# Patient Record
Sex: Male | Born: 1955 | Race: White | Hispanic: No | Marital: Married | Smoking: Never smoker
Health system: Southern US, Community
[De-identification: ages and names within clinical notes are randomized; demographics above are authoritative.]

## PROBLEM LIST (undated history)

## (undated) DIAGNOSIS — E785 Hyperlipidemia, unspecified: Secondary | ICD-10-CM

## (undated) DIAGNOSIS — J3489 Other specified disorders of nose and nasal sinuses: Secondary | ICD-10-CM

## (undated) DIAGNOSIS — T7840XA Allergy, unspecified, initial encounter: Secondary | ICD-10-CM

## (undated) DIAGNOSIS — Z87442 Personal history of urinary calculi: Secondary | ICD-10-CM

## (undated) HISTORY — DX: Personal history of urinary calculi: Z87.442

## (undated) HISTORY — DX: Hyperlipidemia, unspecified: E78.5

## (undated) HISTORY — DX: Other specified disorders of nose and nasal sinuses: J34.89

## (undated) HISTORY — DX: Allergy, unspecified, initial encounter: T78.40XA

---
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
## 1993-03-04 HISTORY — PX: HERNIA REPAIR: SHX51

## 1995-03-05 HISTORY — PX: VASECTOMY: SHX75

## 1998-03-04 HISTORY — PX: EYE SURGERY: SHX253

## 2004-03-04 HISTORY — PX: LITHOTRIPSY: SUR834

## 2005-10-02 ENCOUNTER — Ambulatory Visit: Payer: Self-pay | Admitting: Internal Medicine

## 2005-10-30 ENCOUNTER — Ambulatory Visit: Payer: Self-pay | Admitting: Internal Medicine

## 2005-11-02 LAB — HM COLONOSCOPY

## 2005-11-12 ENCOUNTER — Ambulatory Visit: Payer: Self-pay | Admitting: Internal Medicine

## 2006-07-23 ENCOUNTER — Ambulatory Visit: Payer: Self-pay | Admitting: Internal Medicine

## 2006-07-23 ENCOUNTER — Emergency Department (HOSPITAL_COMMUNITY): Admission: EM | Admit: 2006-07-23 | Discharge: 2006-07-23 | Payer: Self-pay | Admitting: Emergency Medicine

## 2006-07-24 ENCOUNTER — Ambulatory Visit (HOSPITAL_BASED_OUTPATIENT_CLINIC_OR_DEPARTMENT_OTHER): Admission: RE | Admit: 2006-07-24 | Discharge: 2006-07-24 | Payer: Self-pay | Admitting: Urology

## 2006-07-31 ENCOUNTER — Ambulatory Visit (HOSPITAL_COMMUNITY): Admission: RE | Admit: 2006-07-31 | Discharge: 2006-07-31 | Payer: Self-pay | Admitting: Urology

## 2007-03-19 ENCOUNTER — Emergency Department (HOSPITAL_COMMUNITY): Admission: EM | Admit: 2007-03-19 | Discharge: 2007-03-19 | Payer: Self-pay | Admitting: Emergency Medicine

## 2007-12-16 ENCOUNTER — Ambulatory Visit: Payer: Self-pay | Admitting: Internal Medicine

## 2007-12-16 DIAGNOSIS — Z87442 Personal history of urinary calculi: Secondary | ICD-10-CM

## 2007-12-16 DIAGNOSIS — E785 Hyperlipidemia, unspecified: Secondary | ICD-10-CM

## 2007-12-21 ENCOUNTER — Encounter (INDEPENDENT_AMBULATORY_CARE_PROVIDER_SITE_OTHER): Payer: Self-pay | Admitting: *Deleted

## 2007-12-21 LAB — CONVERTED CEMR LAB
ALT: 25 units/L (ref 0–53)
AST: 22 units/L (ref 0–37)
Basophils Absolute: 0 10*3/uL (ref 0.0–0.1)
Basophils Relative: 0.2 % (ref 0.0–3.0)
CO2: 30 meq/L (ref 19–32)
Chloride: 106 meq/L (ref 96–112)
Creatinine, Ser: 1 mg/dL (ref 0.4–1.5)
Eosinophils Relative: 5.8 % — ABNORMAL HIGH (ref 0.0–5.0)
Glucose, Bld: 96 mg/dL (ref 70–99)
Hemoglobin: 14.3 g/dL (ref 13.0–17.0)
Lymphocytes Relative: 25.7 % (ref 12.0–46.0)
MCHC: 34.4 g/dL (ref 30.0–36.0)
Monocytes Relative: 5.7 % (ref 3.0–12.0)
Neutro Abs: 3.3 10*3/uL (ref 1.4–7.7)
Neutrophils Relative %: 62.6 % (ref 43.0–77.0)
PSA: 1.25 ng/mL (ref 0.10–4.00)
RBC: 4.57 M/uL (ref 4.22–5.81)
Total Bilirubin: 1 mg/dL (ref 0.3–1.2)
Total Protein: 6.8 g/dL (ref 6.0–8.3)
WBC: 5.3 10*3/uL (ref 4.5–10.5)

## 2008-09-23 ENCOUNTER — Ambulatory Visit: Payer: Self-pay | Admitting: Internal Medicine

## 2008-09-23 ENCOUNTER — Telehealth (INDEPENDENT_AMBULATORY_CARE_PROVIDER_SITE_OTHER): Payer: Self-pay | Admitting: *Deleted

## 2008-09-23 LAB — CONVERTED CEMR LAB: HDL goal, serum: 40 mg/dL

## 2008-12-19 ENCOUNTER — Telehealth: Payer: Self-pay | Admitting: Internal Medicine

## 2009-01-17 ENCOUNTER — Ambulatory Visit: Payer: Self-pay | Admitting: Internal Medicine

## 2009-01-23 LAB — CONVERTED CEMR LAB
ALT: 35 units/L (ref 0–53)
Alkaline Phosphatase: 49 units/L (ref 39–117)
Basophils Relative: 0.3 % (ref 0.0–3.0)
Bilirubin, Direct: 0 mg/dL (ref 0.0–0.3)
CO2: 28 meq/L (ref 19–32)
Chloride: 106 meq/L (ref 96–112)
Creatinine, Ser: 0.9 mg/dL (ref 0.4–1.5)
Eosinophils Relative: 4.6 % (ref 0.0–5.0)
Glucose, Bld: 88 mg/dL (ref 70–99)
HCT: 40.4 % (ref 39.0–52.0)
Hemoglobin: 13.8 g/dL (ref 13.0–17.0)
Lymphs Abs: 1.3 10*3/uL (ref 0.7–4.0)
MCV: 93.7 fL (ref 78.0–100.0)
Monocytes Absolute: 0.3 10*3/uL (ref 0.1–1.0)
Neutro Abs: 3.1 10*3/uL (ref 1.4–7.7)
PSA: 1.67 ng/mL (ref 0.10–4.00)
RBC: 4.32 M/uL (ref 4.22–5.81)
Sodium: 141 meq/L (ref 135–145)
Total Protein: 6.7 g/dL (ref 6.0–8.3)
WBC: 4.9 10*3/uL (ref 4.5–10.5)

## 2009-01-24 ENCOUNTER — Ambulatory Visit: Payer: Self-pay | Admitting: Internal Medicine

## 2009-04-17 ENCOUNTER — Telehealth (INDEPENDENT_AMBULATORY_CARE_PROVIDER_SITE_OTHER): Payer: Self-pay | Admitting: *Deleted

## 2009-04-19 ENCOUNTER — Ambulatory Visit: Payer: Self-pay | Admitting: Internal Medicine

## 2009-04-19 DIAGNOSIS — M6740 Ganglion, unspecified site: Secondary | ICD-10-CM | POA: Insufficient documentation

## 2009-04-24 LAB — CONVERTED CEMR LAB
AST: 31 units/L (ref 0–37)
HDL: 66.7 mg/dL (ref >39.00)
LDL Cholesterol: 98 mg/dL (ref 0–99)
Total CHOL/HDL Ratio: 3
Triglycerides: 60 mg/dL (ref 0.0–149.0)

## 2009-04-27 ENCOUNTER — Telehealth (INDEPENDENT_AMBULATORY_CARE_PROVIDER_SITE_OTHER): Payer: Self-pay | Admitting: *Deleted

## 2009-10-27 ENCOUNTER — Telehealth (INDEPENDENT_AMBULATORY_CARE_PROVIDER_SITE_OTHER): Payer: Self-pay | Admitting: *Deleted

## 2009-10-31 ENCOUNTER — Ambulatory Visit: Payer: Self-pay | Admitting: Internal Medicine

## 2009-10-31 ENCOUNTER — Telehealth (INDEPENDENT_AMBULATORY_CARE_PROVIDER_SITE_OTHER): Payer: Self-pay | Admitting: *Deleted

## 2009-11-07 LAB — CONVERTED CEMR LAB
AST: 23 units/L (ref 0–37)
Albumin: 4.3 g/dL (ref 3.5–5.2)
Alkaline Phosphatase: 49 units/L (ref 39–117)
HDL: 58.4 mg/dL (ref >39.00)
LDL Cholesterol: 117 mg/dL — ABNORMAL HIGH (ref 0–99)
Total Bilirubin: 0.7 mg/dL (ref 0.3–1.2)
Total CHOL/HDL Ratio: 3
Triglycerides: 49 mg/dL (ref 0.0–149.0)

## 2010-01-29 ENCOUNTER — Ambulatory Visit: Payer: Self-pay | Admitting: Internal Medicine

## 2010-01-29 ENCOUNTER — Encounter: Payer: Self-pay | Admitting: Internal Medicine

## 2010-01-29 DIAGNOSIS — K4090 Unilateral inguinal hernia, without obstruction or gangrene, not specified as recurrent: Secondary | ICD-10-CM | POA: Insufficient documentation

## 2010-01-29 LAB — CONVERTED CEMR LAB: LDL Goal: 140 mg/dL

## 2010-03-25 ENCOUNTER — Encounter: Payer: Self-pay | Admitting: Urology

## 2010-04-03 NOTE — Progress Notes (Signed)
Summary: Lump on wrist/ ? Next lab appointment  Phone Note Call from Patient Call back at Work Phone [phone] Call back at [phone]   Caller: Patient Summary of Call: Message left on VM 4:30pm. Last CPX patient was given RX and now has 2 weeks worth, needs to schedule lab appointment. Patient also with lump on wrist  that he needs to discuss.  Please call./[facility]  April 17, 2009 4:56 PM   Follow-up for Phone Call        Spoke with patient, schedule appointment for Wed to futher discuss. Patient aware I will futher follow-up with Dr.Hopper to see when labs need to be rechecked Follow-up by: Shonna Chock,  April 17, 2009 5:05 PM  Additional Follow-up for Phone Call Additional follow up Details #1::        Per Dr.Hopper have patient come in fasting to appointment Wed and we will check then.   Left message on VM for patient with that information Additional Follow-up by: Shonna Chock,  April 18, 2009 8:56 AM

## 2010-04-03 NOTE — Progress Notes (Signed)
Summary: RX  Phone Note Call from Patient   Summary of Call: PT IS COMING IN ON 8-30 FOR LABS AND WILL NEED A REFILL ON HIS PRAVASTATIN TO BE SENT IN TO THE PHARM--[facility] [address] [address]. Initial call taken by: Freddy Jaksch,  October 27, 2009 1:41 PM    Prescriptions: PRAVACHOL 10 MG TABS (PRAVASTATIN SODIUM) 1 by mouth at bedtime  #90 x 0   Entered by:   Shonna Chock CMA   Authorized by:   Marga Melnick MD   Signed by:   Shonna Chock CMA on 10/30/2009   Method used:   Electronically to        [id number] (retail)       [address]       [place], [place]  [address]       Ph: [phone]       Fax: [phone]   RxID:   [id number]

## 2010-04-03 NOTE — Assessment & Plan Note (Signed)
Summary: CPX//PH   Vital Signs:  Patient profile:   55 year old male Height:      71 inches Weight:      172.8 pounds BMI:     24.19 Temp:     97.1 degrees F oral Pulse rate:   64 / minute Resp:     14 per minute BP sitting:   120 / 78  (left arm) Cuff size:   large  Vitals Entered By: Shonna Chock CMA (January 29, 2010 10:54 AM) CC: CPX: not fasting and refill pravachol, Lipid Management Comments Patient given information about shingles vaccine- will check with insurance company Flu vaccine updated at local pharmacy 11/2009   CC:  CPX: not fasting and refill pravachol and Lipid Management.  History of Present Illness:       [name] is here for a physical; he has noted intermittent L inguinal discomfort after prolonged periods on his feet, most recently 01/26/2010.       Hyperlipidemia Follow-Up:  The patient denies muscle aches, GI upset, abdominal pain, flushing, itching, constipation, diarrhea, and fatigue.  The patient denies the following symptoms: chest pain/pressure, exercise intolerance, dypsnea, palpitations, syncope, and pedal edema.  Compliance with medications (by patient report) has been near 100%.  Dietary compliance has been good.  The patient reports exercising occasionally & seasonally .  Adjunctive measures currently used by the patient include ASA irregularly.   Labs were @ goal  in 10/2009.  Lipid Management History:      Positive NCEP/ATP III risk factors include male age 52 years old or older.  Negative NCEP/ATP III risk factors include non-diabetic, no family history for ischemic heart disease, non-tobacco-user status, non-hypertensive, no ASHD (atherosclerotic heart disease), no prior stroke/TIA, no peripheral vascular disease, and no history of aortic aneurysm.     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 Framingham Study LDL goal = < 160.  Past Surgical History: Colonoscopy negative  2007, Dr Leone Payor; Radial keratotomy 2001;R Inguinal herniorrhaphy 1995; Tonsillectomy 1963; Vasectomy 56  Family History: Father: pancreatic/ liver  cancer ,DM Mother: MVP Siblings: bro: DM,HTN; no colon  cancer  in FH  Social History: Occupation: Store Social worker) Married Never Smoked Alcohol use-yes:rarely  Review of Systems  The patient denies anorexia, fever, weight loss, weight gain, vision loss, decreased hearing, hoarseness, prolonged cough, hemoptysis, melena, hematochezia, severe indigestion/heartburn, hematuria, suspicious skin lesions, depression, unusual weight change, abnormal bleeding, enlarged lymph nodes, and angioedema.    Physical Exam  General:  Thin,well-nourished; alert,appropriate and cooperative throughout examination Head:  Normocephalic and atraumatic without obvious abnormalities. No  alopecia Eyes:  No corneal or conjunctival inflammation noted.Perrla. Funduscopic exam benign, without hemorrhages, exudates or papilledema.  Ears:  External ear exam shows no significant lesions or deformities.  Otoscopic examination reveals clear canals, tympanic membranes are intact bilaterally without bulging, retraction, inflammation or discharge. Hearing is grossly normal bilaterally. Nose:  External nasal examination shows no deformity or inflammation. Nasal mucosa are pink and moist without lesions or exudates. Septal perforation Mouth:  Oral mucosa and oropharynx without lesions or exudates.  Teeth in good repair. Neck:  No deformities, masses, or tenderness noted. Lungs:  Normal respiratory effort, chest expands symmetrically. Lungs are clear to auscultation, no  crackles or wheezes. Heart:  Normal rate and regular rhythm. S1 and S2 normal  without gallop, murmur, click, rub or other extra sounds. Abdomen:  Bowel sounds positive,abdomen soft and non-tender without masses, organomegaly . reducible L inguinal hernia  noted. Rectal:  No external abnormalities noted. Normal sphincter tone. No rectal masses or tenderness. Genitalia:  Testes bilaterally descended without nodularity, tenderness or masses. No scrotal masses or lesions. No penis lesions or urethral discharge. Vasectomy scar tissue Prostate:  Prostate gland firm and smooth, no enlargement, nodularity, tenderness, mass, asymmetry or induration. Msk:  No deformity or scoliosis noted of thoracic or lumbar spine.   Pulses:  R and L carotid,radial,dorsalis pedis and posterior tibial pulses are full and equal bilaterally Extremities:  No clubbing, cyanosis, edema, or deformity noted with normal full range of motion of all joints.   Neurologic:  alert & oriented X3 and DTRs symmetrical and normal.   Skin:  Intact without suspicious lesions or rashes Cervical Nodes:  No lymphadenopathy noted Axillary Nodes:  No palpable lymphadenopathy Inguinal Nodes:  No significant adenopathy Psych:  memory intact for recent and remote, normally interactive, and good eye contact.     Impression & Recommendations:  Problem # 1:  ROUTINE GENERAL MEDICAL EXAM@HEALTH  CARE FACL (ICD-V70.0)  Orders: EKG w/ Interpretation (93000)  Problem # 2:  HYPERLIPIDEMIA (ICD-272.4)  His updated medication list for this problem includes:    Pravachol 10 Mg Tabs (Pravastatin sodium) .Marland Kitchen... 1 by mouth at bedtime  Problem # 3:  INGUINAL HERNIA, LEFT (ICD-550.90) Elective surgery discussed  Complete Medication List: 1)  Pravachol 10 Mg Tabs (Pravastatin sodium) .Marland Kitchen.. 1 by mouth at bedtime 2)  Aspirin 81 Mg Tabs (Aspirin) .Marland Kitchen.. 1  once daily with a meal  Lipid Assessment/Plan:      Based on NCEP/ATP III, the patient's risk factor category is "0-1 risk factors".  The patient's lipid goals are as follows: Total  cholesterol goal is 200; LDL cholesterol goal is 140; HDL cholesterol goal is 40; Triglyceride goal is 150.  His LDL cholesterol goal has not been met.  Secondary causes for hyperlipidemia have been ruled out.  He has been counseled on adjunctive measures for lowering his cholesterol and has been provided with dietary instructions.    Patient Instructions: 1)  Call when you wish to pursue elective surgery for the hernia.  Call  immediately if Warning Signs appear ( persistant pain). Prescriptions: PRAVACHOL 10 MG TABS (PRAVASTATIN SODIUM) 1 by mouth at bedtime  #90 x 3   Entered and Authorized by:   Marga Melnick MD   Signed by:   Marga Melnick MD on 01/29/2010   Method used:   Print then Give to Patient   RxID:   [id number]    Orders Added: 1)  Est. Patient 40-64 years [99396] 2)  EKG w/ Interpretation [93000]

## 2010-04-03 NOTE — Assessment & Plan Note (Signed)
Summary: Lump on wrist/scm   Vital Signs:  Patient profile:   55 year old male Weight:      174 pounds Temp:     98.4 degrees F oral Pulse rate:   72 / minute Resp:     15 per minute BP sitting:   110 / 78  (left arm) Cuff size:   large  Vitals Entered By: Shonna Chock (April 19, 2009 10:59 AM) CC: 1.) Lump left wrist x 2-3 weeks 2.)Rash underarms for 6-9 months Comments REVIEWED MED LIST, PATIENT AGREED DOSE AND INSTRUCTION CORRECT    CC:  1.) Lump left wrist x 2-3 weeks 2.)Rash underarms for 6-9 months.  History of Present Illness: Painless "lump" dorsum L wrist  2-3 weeks w/o trauma; his  mother had Dupuytren's contracture surgery recently.  Rash several months in axillae  , better with change of deodorant. Topical steroid helps.  Allergies (verified): No Known Drug Allergies  Review of Systems MS:  Denies joint pain, joint redness, and joint swelling. Neuro:  Denies weakness.  Physical Exam  General:  in no  distress; alert,appropriate and cooperative throughout examination Pulses:  R and L radial  pulses are full and equal bilaterally Extremities:  Full ROM L wrist . Classic ganglion  12X10 mm L wrist. No Duypuytren's presnt Skin:  Faint irregular rash in axillae. hyperpigmentation Ganglion transilluminates Axillary Nodes:  No palpable lymphadenopathy   Impression & Recommendations:  Problem # 1:  GANGLION CYST, WRIST, LEFT (ICD-727.41)  Problem # 2:  RASH-NONVESICULAR (ICD-782.1) contact dermatitis from deodorant  Problem # 3:  HYPERLIPIDEMIA (ICD-272.4) LDL goal = < 140; labs pending His updated medication list for this problem includes:    Pravastatin Sodium 20 Mg Tabs (Pravastatin sodium) .Marland Kitchen... 1 at bedtime  Complete Medication List: 1)  Pravastatin Sodium 20 Mg Tabs (Pravastatin sodium) .Marland Kitchen.. 1 at bedtime 2)  Aspirin 81 Mg Tabs (Aspirin) .Marland Kitchen.. 1  once daily with a meal  Patient Instructions: 1)  Cort Aid mixed 1 to 1 with Eucerin  & applied two times  a day to rash. Hypoallergenic deordorant or powdered deodorant to prevent rash. Hand Surgery referral if ganglion progresses.  Appended Document: Orders Update    Clinical Lists Changes  Orders: Added new Test order of TLB-Lipid Panel (80061-LIPID) - Signed Added new Test order of TLB-Hepatic/Liver Function Pnl (80076-HEPATIC) - Signed

## 2010-04-03 NOTE — Progress Notes (Signed)
Summary: REFILL REQUEST  Phone Note Call from Patient Call back at Home Phone [phone] Call back at [phone]      New/Updated Medications: PRAVACHOL 10 MG TABS (PRAVASTATIN SODIUM) 1 by mouth at bedtime Prescriptions: PRAVACHOL 10 MG TABS (PRAVASTATIN SODIUM) 1 by mouth at bedtime  #90 x 1   Entered by:   Shonna Chock   Authorized by:   Marga Melnick MD   Signed by:   Shonna Chock on 04/27/2009   Method used:   Electronically to        [facility] [address] (retail)       [address]       [place], [place]  [address]       Ph: [phone]       Fax: [phone]   RxID:   [id number]

## 2010-04-03 NOTE — Progress Notes (Signed)
Summary: RX  Phone Note Call from Patient Call back at Home Phone [phone]   Caller: Patient Summary of Call: PT CAME IN AND TOLD ME HE GAVE ME THE WRONG PHARM-IT NEEDS TO GO [facility] [address]PRAVASTATIN Initial call taken by: Freddy Jaksch,  October 31, 2009 8:35 AM  Follow-up for Phone Call        I called [facility] in [facility] and requested that they cancel rx sent in on 10/27/2009 Follow-up by: Shonna Chock CMA,  October 31, 2009 11:58 AM    Prescriptions: PRAVACHOL 10 MG TABS (PRAVASTATIN SODIUM) 1 by mouth at bedtime  #90 x 0   Entered by:   Shonna Chock CMA   Authorized by:   Marga Melnick MD   Signed by:   Shonna Chock CMA on 10/31/2009   Method used:   Electronically to        [facility] [address] [address] (retail)       [address]       [place], [place]  [address]       Ph: [phone]       Fax: [phone]   RxID:   [id number]

## 2010-07-17 NOTE — Op Note (Signed)
[name], [name] [name]                 ACCOUNT NO.:  [id number]   MEDICAL RECORD NO.:  [id number]          PATIENT TYPE:  AMB   LOCATION:  [facility]                         FACILITY:  [facility]   PHYSICIAN:  Courtney Paris, M.D.DATE OF BIRTH:  Oct 15, 1955   DATE OF PROCEDURE:  07/24/2006  DATE OF DISCHARGE:                               OPERATIVE REPORT   PREOPERATIVE DIAGNOSIS:  Left proximal ureteral stone with obstruction.   POSTOPERATIVE DIAGNOSIS:  Left proximal ureteral stone with obstruction.   OPERATION:  Cystoscopy, left retrograde pyelogram, insertion of left  ureteral stent.   ANESTHESIA:  General.   SURGEON:  Courtney Paris, M.D.   BRIEF HISTORY:  This 55 year old patient who presented yesterday with  acute left flank pain was found to have a 6 mm stone in the proximal  left ureter with moderate hydronephrosis.  He was to have lithotripsy  but was found to have taken ibuprofen less than 48 hours before so the  lithotripsy had to be cancelled.  He enters now to have a stent placed  for persistent pain.   The patient was placed on the operating table in dorsal lithotomy  position.  After satisfactory induction of general anesthesia, he was  prepped and draped with Betadine in the usual sterile fashion.  The  anterior urethra was inspected and no anterior strictures seen.  Post  urethra was mildly obstructing and the bladder was entered.  The left  orifice was somewhat small and I had to put a sensor guidewire through  the #5 open-ended ureteral catheter to be able to get into the left  ureteral orifice.  The guidewire was then removed and an occlusive  retrograde demonstrated the stone about L3 on the left with some  proximal hydronephrosis noted.   Through the open-ended catheter I passed the sensor guidewire and then  under fluoroscopy was able to get past the stone up into the level of  the kidney.  The open-ended catheter was then removed.  Over the  guidewire a  6-French x 26 cm length double-J ureteral stent was passed  and when the guidewire was removed, the end was in the lower pole, it  just did not seem long enough.  Knowing he was going to have to have the  stent probably for 10 days, I felt this was not adequate.  I pulled the  stent out and then passed the guidewire again under direct vision and  was able to negotiate past the stone.  Over the guidewire I passed a 6 x  28 cm length double-J ureteral stent and this time when the guidewire  was removed, there was a nice coil in the renal pelvis and one in the  bladder.  The bladder was drained, the scope removed, and he was given  Toradol for discomfort and was taken to the recovery room in good  condition.  The plan is to do lithotripsy in a week on Jul 31, 2006, and  removal of the stent some time afterwards.      Courtney Paris, M.D.  Electronically Signed  HMK/MEDQ  D:  07/24/2006  T:  07/24/2006  Job:  [id number]

## 2010-07-17 NOTE — Assessment & Plan Note (Signed)
[facility]                        [place] OFFICE NOTE   NAME:[name], [name]                          MRN:          [id number]  DATE:07/23/2006                            DOB:          05/22/1955    [name] [name] was seen emergently for back and abdominal pain.   Approximately 6 a.m., he began to experience back pain on a scale of 8  to 9, which was intermittent, until approximately 8:00 a.m.  It began on  the left lower lumbosacral area and radiated to the LLQ.  There was no  radiation into  the legs and no associated paresthesias.  He noted  oliguria with this.  When he would have acute pain, he would also note  shortness of breath.   PAST MEDICAL HISTORY:  Past history includes right inguinal  herniorrhaphy in 1995.  He had a tonsillectomy at age 14.  He had radial  keratotomy in 2001.  Colonoscopy in September 2007 was negative.  He  also has a history of a perforated nasal septum &  epistaxis, secondary  to ephedra use.   FAMILY HISTORY:  His father had pancreatic cancer and liver mets and  diabetes.  His brother had diabetes and hypertension.  Mother had MVP.   SOCIAL HISTORY:  He never smokes and rarely drinks.   ALLERGIES:  HE HAS NO KNOWN DRUG ALLERGIES.   MEDICATIONS:  He takes ibuprofen as needed.   REVIEW OF SYSTEMS:  Negative for any signs of respiratory tract  infection.  He has had no chest pains or palpitations.  He has had no  musculoskeletal or neurologic symptoms.  There has been no changes in  his bowel movements.   EXAMINATION:  GENERAL:  He appears uncomfortable.  VITAL SIGNS:  Temperature is 97.6, pulse is 76.  Respiratory rate varies  from 12 to 20.  Blood pressure is 140/84.  He has no scleral icterus or  jaundice.  HEENT:  The oral mucosa is well hydrated.  CHEST:  Clear to auscultation.  Chest is clear with no increased work of  breathing.  CARDIAC:  He exhibits some S4 without significant murmur.  ABDOMEN:   Bowel sounds are active.  He is slightly tender in the left  lower quadrant &  suprapubic area.  MUSCULOSKELETAL:  Reveals normal range of motion.   All pulses are intact, and there is no edema.   He has a reducible hernia in the left inguinal area.  Palpation of this  does not induce the pain.   There is a varicocele in the left scrotum, which is nontender.   Prostate is nontender; stool is equivocally trace positive.  There is no  adnexal tenderness.   He presents classically for renal calculi.   Stat CT urogram will be completed at [facility].  Orders will  be given for morphine intravenously for pain control.  He will also  strain his urine.   CONSULTATION:  Consultation with Urology will be pursued.     Titus Dubin. Alwyn Ren, MD,FACP,FCCP  Electronically Signed    WFH/MedQ  DD: 07/23/2006  DT: 07/23/2006  Job #: [id number]

## 2010-07-17 NOTE — Consult Note (Signed)
[name] [name], [name] [name]                 ACCOUNT NO.:  [id number]   MEDICAL RECORD NO.:  [id number]          PATIENT TYPE:  EMS   LOCATION:  ED                           FACILITY:  [facility]   PHYSICIAN:  Courtney Paris, M.D.DATE OF BIRTH:  [birth year]   DATE OF CONSULTATION:  DATE OF DISCHARGE:                                 CONSULTATION   Requested by Dr. Marga Melnick.   REASON FOR CONSULTATION:  Left kidney stone.  This 55 year old patient  was admitted to the emergency room actually through Dr. Frederik Pear office  for evaluation of left flank pain.  He had a CT scan which showed an  obstructing 5.9 cm stone in the proximal left ureter.  He has been  reasonably comfortable since he got some Demerol and Phenergan at Dr.  Frederik Pear office.  He had no previous stones.  No family history of  stones.  He has been fairly comfortable since earlier this morning.  The  patient has had no urinary symptoms, no blood in his urine, no fever.   PAST SURGICAL HISTORY:  Includes an inguinal hernia 1995, T&A as a  child, vasectomy in 1998 and radial keratotomy for his eyes.   He has no allergies, takes no regular medication.   He has two children; an 36 year old daughter and a 71 year old son. His  father died of liver and pancreas cancer at age 58.  His mother is 64,  still living.  He has one brother and no family history of stones.  No  family history of diabetes, heart disease or other significant cancers.   Family history, social history and review of systems were otherwise  negative except as noted.  His blood pressure is 139/91, pulse is 73,  respirations 18.  He is 174 pounds.  He works as a Aeronautical engineer  at [employer].   PHYSICAL EXAMINATION:  GENERAL:  The patient is a pleasant middle-aged  male in no acute distress.  HEENT is clear.  NECK: Supple.  LUNGS:  Clear.  ABDOMEN:  Soft.  Liver, spleen not palpable.  Bladder is not distended.  GU:  Penis normal circumcised, adequate  meatus, bilaterally descended  testes.  Prostate small, benign.  EXTREMITIES:  Negative.  No edema.  Good distal pulses.  Does have some mild CVA tenderness.   CT scan was reviewed and was as mentioned above.   IMPRESSION:  1. Proximal 6 mL left upper ureteral stone.  2. Left flank pain.  3. Hematuria.   RECOMMENDATIONS:  Send out only with some oxycodone. Set up lithotripsy  tomorrow as an outpatient procedure through my office early in the  morning.      Courtney Paris, M.D.  Electronically Signed     HMK/MEDQ  D:  07/23/2006  T:  07/24/2006  Job:  [id number]   cc:   Titus Dubin. Alwyn Ren, MD,FACP,FCCP  [phone] [place]  [place] [address]

## 2011-02-21 ENCOUNTER — Other Ambulatory Visit: Payer: Self-pay | Admitting: Internal Medicine

## 2011-02-21 MED ORDER — PRAVASTATIN SODIUM 10 MG PO TABS: 10.0000 mg | ORAL_TABLET | Freq: Every day | ORAL | Status: DC

## 2011-02-21 NOTE — Telephone Encounter (Signed)
RX sent in, patient aware. 

## 2011-03-06 ENCOUNTER — Telehealth: Payer: Self-pay | Admitting: Internal Medicine

## 2011-03-06 ENCOUNTER — Other Ambulatory Visit: Payer: Self-pay | Admitting: Internal Medicine

## 2011-03-06 DIAGNOSIS — IMO0001 Reserved for inherently not codable concepts without codable children: Secondary | ICD-10-CM

## 2011-03-06 NOTE — Telephone Encounter (Signed)
Patient has been seen for hernia past few years - he would like to be referred to surgeon - he has an appt [phone] but wants to get referral started

## 2011-03-06 NOTE — Telephone Encounter (Signed)
Dr.Hopper please advise 

## 2011-03-11 ENCOUNTER — Encounter (INDEPENDENT_AMBULATORY_CARE_PROVIDER_SITE_OTHER): Payer: Self-pay | Admitting: General Surgery

## 2011-03-11 ENCOUNTER — Ambulatory Visit (INDEPENDENT_AMBULATORY_CARE_PROVIDER_SITE_OTHER): Payer: 59 | Admitting: General Surgery

## 2011-03-11 DIAGNOSIS — K4090 Unilateral inguinal hernia, without obstruction or gangrene, not specified as recurrent: Secondary | ICD-10-CM

## 2011-03-11 DIAGNOSIS — K4091 Unilateral inguinal hernia, without obstruction or gangrene, recurrent: Secondary | ICD-10-CM | POA: Insufficient documentation

## 2011-03-11 NOTE — Progress Notes (Signed)
Patient ID: [name] [name], male   DOB: [birth year], 56 y.o.   MRN: [id number]  Chief Complaint  Patient presents with  . Other    new pt- eval [facility]    HPI [name] [name] is a 56 y.o. male.   HPI He is referred by Dr. Alwyn Ren for evaluation of a left inguinal hernia. This has been present for 2 years. Recently however it is larger and becomes uncomfortable at times. Sometimes he has to manually reduce this. He is interested in having this repaired. He had a right inguinal hernia repair done in 1995 in [place] He has had no problems with his right inguinal hernia repair. He does not strain to urinate. He denies constipation. No chronic cough. Past Medical History  Diagnosis Date  . Hyperlipidemia   . Allergy   . Inguinal hernia     left  . Chronic kidney disease     hx of kidney stones    Past Surgical History  Procedure Date  . Hernia repair 1995    [facility]H  . Vasectomy 1997  . Lithotripsy 2006?  [facility] surgery 2000?    PRK bilateral    Family History  Problem Relation Age of Onset  . Heart disease Mother     mitral valve prolapse  . Cancer Father     liver and pancreatic    Social History History  Substance Use Topics  . Smoking status: Never Smoker   . Smokeless tobacco: Not on file  . Alcohol Use: No    No Known Allergies  Current Outpatient Prescriptions  Medication Sig Dispense Refill  . loratadine (CLARITIN) 10 MG tablet Take 10 mg by mouth daily.        . pravastatin (PRAVACHOL) 10 MG tablet Take 1 tablet (10 mg total) by mouth daily.  90 tablet  0    Review of Systems Review of Systems  Constitutional: Negative.   HENT: Negative.   Respiratory: Negative.   Cardiovascular: Negative.   Gastrointestinal: Negative.   Genitourinary: Negative for difficulty urinating.  Hematological: Negative.     Blood pressure 126/86, pulse 76, temperature 97.4 F (36.3 C), temperature source Temporal, resp. rate 16, height 6' (1.829 m), weight 167 lb 9.6 oz (76.023  kg).  Physical Exam Physical Exam  Constitutional: He appears well-developed and well-nourished. No distress.  HENT:  Head: Normocephalic and atraumatic.  Eyes: Conjunctivae and EOM are normal.  Neck: Neck supple.  Cardiovascular: Normal rate and regular rhythm.   Pulmonary/Chest: Effort normal and breath sounds normal.  Abdominal: Soft. He exhibits no distension and no mass. There is no tenderness.  Genitourinary:       A left inguinal bulge is present that is reducible in the supine position. A right inguinal scar is noted and a small bulge is present which is reducible in the supine position. No testicular masses.  Musculoskeletal: Normal range of motion. He exhibits no edema.  Lymphadenopathy:    He has no cervical adenopathy.    Data Reviewed None  Assessment    Symptomatic left inguinal hernia and asymptomatic recurrent right inguinal hernia.    Plan    We discussed 2 options. The first option would be to do an open repair of left inguinal hernia with mesh and then addressed the recurrent right inguinal hernia if it became larger or symptomatic. The second option would be to do a laparoscopic repair of both inguinal hernias with mesh. He wants to see what the cost of each would be  and right now is interested in the open repair of a left inguinal hernia.  I have explained the procedure, risks, and aftercare of inguinal hernia repair.  Risks include but are not limited to bleeding, infection, wound problems, anesthesia, recurrence, bladder or intestine injury, urinary retention, testicular dysfunction, chronic pain, mesh problems.  He seems to understand and agrees to proceed.  If he changes his mind and would like to have a laparoscopic bilateral inguinal hernia repair we can arrange for this as well.       Yardley Beltran J 03/11/2011, 10:34 AM

## 2011-03-11 NOTE — Patient Instructions (Signed)
Avoid activities that cause you to have pain from the hernia.

## 2011-03-12 ENCOUNTER — Encounter: Payer: Self-pay | Admitting: Internal Medicine

## 2011-03-12 ENCOUNTER — Ambulatory Visit (INDEPENDENT_AMBULATORY_CARE_PROVIDER_SITE_OTHER): Payer: Self-pay | Admitting: Internal Medicine

## 2011-03-12 VITALS — BP 118/70 | HR 69 | Temp 98.4°F | Resp 12 | Ht 71.5 in | Wt 167.0 lb

## 2011-03-12 DIAGNOSIS — Z0000 Encounter for general adult medical examination without abnormal findings: Secondary | ICD-10-CM

## 2011-03-12 DIAGNOSIS — E785 Hyperlipidemia, unspecified: Secondary | ICD-10-CM

## 2011-03-12 LAB — CBC WITH DIFFERENTIAL/PLATELET
Basophils Relative: 0.5 % (ref 0.0–3.0)
Eosinophils Absolute: 0.6 10*3/uL (ref 0.0–0.7)
Eosinophils Relative: 7.6 % — ABNORMAL HIGH (ref 0.0–5.0)
HCT: 41.4 % (ref 39.0–52.0)
Lymphs Abs: 1.3 10*3/uL (ref 0.7–4.0)
MCHC: 33.3 g/dL (ref 30.0–36.0)
MCV: 93.1 fl (ref 78.0–100.0)
Monocytes Absolute: 0.4 10*3/uL (ref 0.1–1.0)
Neutro Abs: 5.4 10*3/uL (ref 1.4–7.7)
RBC: 4.45 Mil/uL (ref 4.22–5.81)
WBC: 7.7 10*3/uL (ref 4.5–10.5)

## 2011-03-12 LAB — BASIC METABOLIC PANEL
CO2: 29 mEq/L (ref 19–32)
Chloride: 106 mEq/L (ref 96–112)
Creatinine, Ser: 1 mg/dL (ref 0.4–1.5)
Potassium: 4.4 mEq/L (ref 3.5–5.1)

## 2011-03-12 LAB — HEPATIC FUNCTION PANEL
ALT: 22 U/L (ref 0–53)
Albumin: 4.4 g/dL (ref 3.5–5.2)
Total Protein: 7 g/dL (ref 6.0–8.3)

## 2011-03-12 LAB — LIPID PANEL: VLDL: 11 mg/dL (ref 0.0–40.0)

## 2011-03-12 NOTE — Assessment & Plan Note (Signed)
He has been compliant with his statin; he has no adverse effects. Lipids and hepatic panel will be checked.

## 2011-03-12 NOTE — Patient Instructions (Signed)
Preventive Health Care: Exercise at least 30-45 minutes a day,  3-4 days a week. Eat a low-fat diet with lots of fruits and vegetables, up to 7-9 servings per day. Consume less than 40 grams of sugar per day from foods & drinks with High Fructose Corn Sugar as #1,2,3 or # 4 on label. Eye Doctor - have an eye exam @ least annually.                                                          

## 2011-03-12 NOTE — Progress Notes (Signed)
Subjective:    Patient ID: [name] [name], male    DOB: 04/02/1955, 56 y.o.   MRN: [id number]  HPI  [name] [name]  is here for a physical;acute issues include symptomatic hernia for which laparoscopic surgery is planned next week by Dr. Abbey Chatters. Actually he will have bilateral herniorrhaphy as there has been  recurrence at the site of the previous hernia repair as well.      Review of Systems Patient reports no  vision/ hearing changes,anorexia, weight change, fever ,adenopathy, persistant / recurrent hoarseness, swallowing issues, chest pain,palpitations, edema,persistant / recurrent cough, hemoptysis, dyspnea(rest, exertional, paroxysmal nocturnal), gastrointestinal  bleeding (melena, rectal bleeding), abdominal pain, excessive heart burn, GU symptoms( dysuria, hematuria, pyuria, voiding/incontinence  issues) syncope, focal weakness, memory loss, skin/hair/nail changes,depression, anxiety, abnormal bruising/bleeding,or  musculoskeletal symptoms/signs. He has had intermittent numbness and tingling in the fourth left finger.     Objective:   Physical Exam Gen.: Thin but healthy and well-nourished in appearance. Alert, appropriate and cooperative throughout exam. Head: Normocephalic without obvious abnormalities  Eyes: No corneal or conjunctival inflammation noted. Pupils equal round reactive to light and accommodation. Fundal exam is benign without hemorrhages, exudate, papilledema. Extraocular motion intact. Vision grossly normal. Ears: External  ear exam reveals no significant lesions or deformities. Canals clear .TMs normal. Hearing is grossly normal bilaterally. Nose: External nasal exam reveals no deformity or inflammation. Nasal mucosa are pink and moist. No lesions or exudates noted.  Mouth: Oral mucosa and oropharynx reveal no lesions or exudates. Teeth in good repair. Neck: No deformities, masses, or tenderness noted. Range of motion & Thyroid normal Lungs: Normal respiratory effort; chest  expands symmetrically. Lungs are clear to auscultation without rales, wheezes, or increased work of breathing. Heart: Normal rate and rhythm. Normal S1 and S2. No gallop, click, or rub.No murmur. Abdomen: Bowel sounds normal; abdomen soft and nontender. No masses, organomegaly or hernias noted. Genitalia: Dr Abbey Chatters. The prostate is not significantly enlarged; there is no asymmetry, nodularity, or induration.                                                                               Musculoskeletal/extremities: No deformity or scoliosis noted of  the thoracic or lumbar spine. No clubbing, cyanosis, edema, or deformity noted. Range of motion  normal .Tone & strength  normal.Joints normal. Nail health  good. Ganglion dorsum of left hand Vascular: Carotid, radial artery, dorsalis pedis and  posterior tibial pulses are full and equal. No bruits present. Neurologic: Alert and oriented x3. Deep tendon reflexes symmetrical and normal.          Skin: Intact without suspicious lesions or rashes. Lymph: No cervical, axillary lymphadenopathy present. Psych: Mood and affect are normal. Normally interactive  Assessment & Plan:  #1 comprehensive physical exam; no contraindication to the planned laparoscopic surgery.  #2 see Problem List with Assessments & Recommendations

## 2011-03-13 LAB — LDL CHOLESTEROL, DIRECT: Direct LDL: 118.6 mg/dL

## 2011-03-21 DIAGNOSIS — K4090 Unilateral inguinal hernia, without obstruction or gangrene, not specified as recurrent: Secondary | ICD-10-CM

## 2011-03-21 DIAGNOSIS — K4091 Unilateral inguinal hernia, without obstruction or gangrene, recurrent: Secondary | ICD-10-CM

## 2011-03-21 HISTORY — PX: HERNIA REPAIR: SHX51

## 2011-04-05 ENCOUNTER — Encounter (INDEPENDENT_AMBULATORY_CARE_PROVIDER_SITE_OTHER): Payer: Self-pay | Admitting: General Surgery

## 2011-04-05 ENCOUNTER — Ambulatory Visit (INDEPENDENT_AMBULATORY_CARE_PROVIDER_SITE_OTHER): Payer: 59 | Admitting: General Surgery

## 2011-04-05 VITALS — BP 128/82 | HR 70 | Temp 97.9°F | Resp 16 | Ht 72.0 in | Wt 165.6 lb

## 2011-04-05 DIAGNOSIS — Z9889 Other specified postprocedural states: Secondary | ICD-10-CM

## 2011-04-05 NOTE — Progress Notes (Signed)
Operation:  Laparoscopic bilateral inguinal hernia repair with mesh(right sided recurrent)  Date: March 21, 2011  Pathology:na  HPI: He is here for his first postoperative visit. He was more active on Monday and Tuesday developed more discomfort in the right groin area.  The swelling is significantly decreased.   Physical Exam: Abdomen-incisions are clean, dry, and intact  GU-minimal right sided swelling; no left-sided swelling; repairs are solid.  Assessment: Still with some postoperative discomfort but is improving overall.  Plan: Continue light activities. Return visit one month.

## 2011-04-05 NOTE — Patient Instructions (Signed)
Continue light activities as previously instructed.

## 2011-04-18 ENCOUNTER — Encounter (INDEPENDENT_AMBULATORY_CARE_PROVIDER_SITE_OTHER): Payer: Self-pay | Admitting: General Surgery

## 2011-04-29 ENCOUNTER — Telehealth (INDEPENDENT_AMBULATORY_CARE_PROVIDER_SITE_OTHER): Payer: Self-pay

## 2011-04-29 NOTE — Telephone Encounter (Signed)
Called pt to inform him that Dr. Abbey Chatters says his symptoms were normal after inguinal hernia surgery.  He is fine to wait until his post op appointment.

## 2011-04-29 NOTE — Telephone Encounter (Signed)
[name] called today.  His BIH repair was 2/19.  One week ago he noticed a lump in his left groin area.  It is not at the incision.  It has not gotten larger.  It is not painful to touch.  He would like to know if he should be concerned and/or should he be seen prior to his post op appt.  I told him I would let Dr. Abbey Chatters know and call him back.

## 2011-05-07 ENCOUNTER — Encounter (INDEPENDENT_AMBULATORY_CARE_PROVIDER_SITE_OTHER): Payer: Self-pay | Admitting: General Surgery

## 2011-05-09 ENCOUNTER — Encounter (INDEPENDENT_AMBULATORY_CARE_PROVIDER_SITE_OTHER): Payer: Self-pay | Admitting: General Surgery

## 2011-05-09 ENCOUNTER — Ambulatory Visit (INDEPENDENT_AMBULATORY_CARE_PROVIDER_SITE_OTHER): Payer: 59 | Admitting: General Surgery

## 2011-05-09 VITALS — BP 136/82 | HR 72 | Temp 98.2°F | Resp 16 | Ht 72.0 in | Wt 172.4 lb

## 2011-05-09 DIAGNOSIS — Z9889 Other specified postprocedural states: Secondary | ICD-10-CM

## 2011-05-09 NOTE — Progress Notes (Signed)
[name] [name] is here for his second postoperative visit after laparoscopic bilateral inguinal hernia repair March 21, 2011. He noted a little knot in the left groin. He's able to work and is having minimal discomfort.  Exam: Abdomen-incisions are clean and intact.  GU-right groin it is solid without swelling. Left groin demonstrates minimal swelling and a small area consistent with seroma.  Assessment: Making good progress.  Plan: Activities as tolerated and we discussed with the means.  Return visit p.r.n.

## 2011-05-09 NOTE — Patient Instructions (Signed)
Activities as tolerated as discussed. 

## 2011-06-03 ENCOUNTER — Other Ambulatory Visit: Payer: Self-pay | Admitting: Internal Medicine

## 2011-08-27 ENCOUNTER — Ambulatory Visit (INDEPENDENT_AMBULATORY_CARE_PROVIDER_SITE_OTHER): Payer: 59 | Admitting: Internal Medicine

## 2011-08-27 ENCOUNTER — Encounter: Payer: Self-pay | Admitting: Internal Medicine

## 2011-08-27 VITALS — BP 110/74 | HR 72 | Temp 98.3°F | Wt 164.0 lb

## 2011-08-27 DIAGNOSIS — L988 Other specified disorders of the skin and subcutaneous tissue: Secondary | ICD-10-CM

## 2011-08-27 DIAGNOSIS — R238 Other skin changes: Secondary | ICD-10-CM

## 2011-08-27 DIAGNOSIS — K625 Hemorrhage of anus and rectum: Secondary | ICD-10-CM

## 2011-08-27 MED ORDER — HYDROCORTISONE ACE-PRAMOXINE 1-1 % RE FOAM: 1.0000 | Freq: Two times a day (BID) | RECTAL | Status: AC

## 2011-08-27 NOTE — Patient Instructions (Addendum)
Use tissue to cleanse the stool; after bowel movements clean with TUCKS  or Baby Wipes. Sitz baths followed by the  Medication 2 to 3 times a day . Stay well hydrated and avoid popcorn and some other materials which might aggravate hemorrhoids.  Soak finger twice a day in saline. The saline can be purchased at the drugstore or you can make your own .Boil cup of salt in a gallon of water. Store mixture  in a clean container.Report Warning  signs as discussed (red streaks, pus, fever, increasing pain).

## 2011-08-27 NOTE — Progress Notes (Signed)
  Subjective:    Patient ID: [name] [name], male    DOB: 06-10-1955, 56 y.o.   MRN: [id number]  HPI For the last week he's noted blood on the tissue following bowel movements. He's had intermittent rectal dryness and itching in the past which has responded to sitz baths. His last colonoscopy was 2007 was negative. There is no family history of gastrointestinal disease except for pancreatic cancer in his father.  This week he noted a lesion on the medial aspect of the left index finger. He knows of no definite trigger or injury. He states that the central indentation did not appear until he manipulated the lesion     Review of Systems He denies epistaxis, hemoptysis, unexplained weight loss, abdominal pain, or melena.  He has not had fever, chills, sweats, or purulent drainage from the lesion     Objective:   Physical Exam  He appears healthy and well-nourished in no distress  He is no scleral icterus or jaundice  Abdomen is nontender with no organomegaly or masses.  Rectal exam is negative externally. No palpableabnormal changes. Stool is trace positive Hemoccult.  Prostate is normal without induration or nodularity  He has a 6 x 6 mm papule over the medial aspect of the left index finger. It has a central indentation. There is no evidence of lymphangitis or cellulitis. He has no epitrochlear lymphadenopathy on that side.        Assessment & Plan:  #1 rectal bleeding, painless. This is manifested as blood on the tissue. Clinically there is no sign of hemorrhoidal tissue or fissure in ano. Hemoccult is trace positive.  #2 papule left index finger; the lesion is that typically seen with a foreign body such as a rose thorn  Plan: Rectal hygiene and ProctoFoam-HC. If symptoms fail to resolve; GI referral  Saline soaks to the papule 2-3 times a day recommended for this if he is so since he

## 2012-03-01 ENCOUNTER — Other Ambulatory Visit: Payer: Self-pay | Admitting: Internal Medicine

## 2012-03-02 NOTE — Telephone Encounter (Signed)
Refill done.  

## 2012-03-13 ENCOUNTER — Ambulatory Visit (INDEPENDENT_AMBULATORY_CARE_PROVIDER_SITE_OTHER): Payer: BC Managed Care – PPO | Admitting: Internal Medicine

## 2012-03-13 ENCOUNTER — Encounter: Payer: Self-pay | Admitting: Internal Medicine

## 2012-03-13 VITALS — BP 110/64 | HR 72 | Temp 97.8°F | Resp 12 | Ht 71.03 in | Wt 164.0 lb

## 2012-03-13 DIAGNOSIS — Z0000 Encounter for general adult medical examination without abnormal findings: Secondary | ICD-10-CM

## 2012-03-13 DIAGNOSIS — J3081 Allergic rhinitis due to animal (cat) (dog) hair and dander: Secondary | ICD-10-CM

## 2012-03-13 DIAGNOSIS — E785 Hyperlipidemia, unspecified: Secondary | ICD-10-CM

## 2012-03-13 LAB — BASIC METABOLIC PANEL
CO2: 29 mEq/L (ref 19–32)
Calcium: 9.2 mg/dL (ref 8.4–10.5)
Sodium: 140 mEq/L (ref 135–145)

## 2012-03-13 LAB — CBC WITH DIFFERENTIAL/PLATELET
Basophils Relative: 0.3 % (ref 0.0–3.0)
Eosinophils Absolute: 0.8 10*3/uL — ABNORMAL HIGH (ref 0.0–0.7)
HCT: 40.3 % (ref 39.0–52.0)
Hemoglobin: 13.4 g/dL (ref 13.0–17.0)
Lymphs Abs: 1.2 10*3/uL (ref 0.7–4.0)
MCHC: 33.3 g/dL (ref 30.0–36.0)
MCV: 90.9 fl (ref 78.0–100.0)
Monocytes Absolute: 0.3 10*3/uL (ref 0.1–1.0)
Neutro Abs: 3.3 10*3/uL (ref 1.4–7.7)
RBC: 4.43 Mil/uL (ref 4.22–5.81)
RDW: 13 % (ref 11.5–14.6)

## 2012-03-13 LAB — LIPID PANEL
Cholesterol: 175 mg/dL (ref 0–200)
HDL: 66.7 mg/dL (ref >39.00)
Triglycerides: 59 mg/dL (ref 0.0–149.0)

## 2012-03-13 LAB — HEPATIC FUNCTION PANEL
Albumin: 4 g/dL (ref 3.5–5.2)
Alkaline Phosphatase: 61 U/L (ref 39–117)
Total Protein: 6.8 g/dL (ref 6.0–8.3)

## 2012-03-13 LAB — TSH: TSH: 1.07 u[IU]/mL (ref 0.35–5.50)

## 2012-03-13 MED ORDER — FLUTICASONE PROPIONATE 50 MCG/ACT NA SUSP: 1.0000 | Freq: Two times a day (BID) | NASAL | Status: DC | PRN

## 2012-03-13 MED ORDER — PRAVASTATIN SODIUM 10 MG PO TABS: ORAL_TABLET | ORAL | Status: DC

## 2012-03-13 NOTE — Progress Notes (Signed)
  Subjective:    Patient ID: [name] [name], male    DOB: 1955/11/03, 57 y.o.   MRN: [id number]  HPI  Mr [name] [name] is here for a physical;acute issues include sneezing with cat exposure      Review of Systems He is on a heart healthy diet; he is not exercising. Specifically he denies chest pain, palpitations, dyspnea, or claudication. Family history is negative for premature coronary disease. Advanced cholesterol testing reveals his LDL goal was less than 140, ideally < 100.      Objective:   Physical Exam Gen.: Thin but healthy and well-nourished in appearance. Alert, appropriate and cooperative throughout exam. Head: Normocephalic without obvious abnormalities Eyes: No corneal or conjunctival inflammation noted. Pupils equal round reactive to light and accommodation. Fundal exam is benign without hemorrhages, exudate, papilledema. Extraocular motion intact. Vision grossly normal. Ears: External  ear exam reveals no significant lesions or deformities. Canals clear .TMs normal. Hearing is grossly normal bilaterally. Nose: External nasal exam reveals no deformity or inflammation. Nasal mucosa are pink and moist. No lesions or exudates noted.  Mouth: Oral mucosa and oropharynx reveal no lesions or exudates. Teeth in good repair. Neck: No deformities, masses, or tenderness noted. Range of motion & Thyroid normal. Lungs: Normal respiratory effort; chest expands symmetrically. Lungs are clear to auscultation without rales, wheezes, or increased work of breathing. Heart: Normal rate and rhythm. Normal S1 and S2. No gallop, click, or rub. S4 w/o murmur. Abdomen: Bowel sounds normal; abdomen soft and nontender. No masses, organomegaly or hernias noted. Genitalia/ WUJ:[id number] normal except for left varices & vasectomy scar tissue. Prostate is normal without enlargement, asymmetry, nodularity, or induration.                                               Musculoskeletal/extremities: No deformity or scoliosis  noted of  the thoracic or lumbar spine. No clubbing, cyanosis, edema, or deformity noted. Range of motion  normal .Tone & strength  normal.Joints normal. Nail health  good. Vascular: Carotid, radial artery, dorsalis pedis and  posterior tibial pulses are full and equal. No bruits present. Neurologic: Alert and oriented x3. Deep tendon reflexes symmetrical and normal.          Skin: Intact without suspicious lesions or rashes. Lymph: No cervical, axillary, or inguinal lymphadenopathy present. Psych: Mood and affect are normal. Normally interactive                                                                                        Assessment & Plan:  #1 comprehensive physical exam; no acute findings #2 extrinsic rhitis Plan: see Orders

## 2012-03-13 NOTE — Patient Instructions (Addendum)
Cardiovascular exercise, this can be as simple a program as walking, is recommended 30-45 minutes 3-4 times per week. If you're not exercising you should take 6-8 weeks to build up to this level.   Plain Mucinex for thick secretions ;force NON dairy fluids . Use a Neti pot daily as needed for sinus congestion; going from open side to congested side . Nasal cleansing in the shower as discussed. Make sure that all residual soap is removed to prevent irritation. Fluticasone 1 spray in each nostril twice a day as needed. Use the "crossover" technique as discussed. Plain Loratidine 10 mg or  Allegra 160 daily as needed for itchy eyes & sneezing.   To prevent palpitations or premature beats, avoid stimulants such as decongestants, diet pills, nicotine, or caffeine (coffee, tea, cola, or chocolate) to excess.

## 2012-04-18 ENCOUNTER — Other Ambulatory Visit: Payer: Self-pay

## 2012-06-04 ENCOUNTER — Other Ambulatory Visit (INDEPENDENT_AMBULATORY_CARE_PROVIDER_SITE_OTHER): Payer: BC Managed Care – PPO

## 2012-06-04 LAB — HEPATIC FUNCTION PANEL
ALT: 15 U/L (ref 0–53)
AST: 17 U/L (ref 0–37)
Albumin: 4.2 g/dL (ref 3.5–5.2)
Alkaline Phosphatase: 52 U/L (ref 39–117)

## 2012-11-25 ENCOUNTER — Encounter: Payer: Self-pay | Admitting: Internal Medicine

## 2012-11-25 ENCOUNTER — Ambulatory Visit (INDEPENDENT_AMBULATORY_CARE_PROVIDER_SITE_OTHER): Payer: BC Managed Care – PPO | Admitting: Internal Medicine

## 2012-11-25 VITALS — BP 111/71 | HR 69 | Temp 98.4°F | Wt 173.4 lb

## 2012-11-25 DIAGNOSIS — J9801 Acute bronchospasm: Secondary | ICD-10-CM

## 2012-11-25 DIAGNOSIS — J069 Acute upper respiratory infection, unspecified: Secondary | ICD-10-CM

## 2012-11-25 MED ORDER — AMOXICILLIN 500 MG PO CAPS: 500.0000 mg | ORAL_CAPSULE | Freq: Three times a day (TID) | ORAL | Status: DC

## 2012-11-25 MED ORDER — FLUTICASONE-SALMETEROL 250-50 MCG/DOSE IN AEPB: 1.0000 | INHALATION_SPRAY | Freq: Two times a day (BID) | RESPIRATORY_TRACT | Status: DC

## 2012-11-25 NOTE — Progress Notes (Signed)
  Subjective:    Patient ID: [name] [name], male    DOB: [birth year], 57 y.o.   MRN: [id number]  HPI   Symptoms have been present one-2 weeks as nasal congestion and stuffiness. He's also had some wheezing and shortness of breath, particularly in the morning.  He questions whether the wheezing may have been related to a carpet cleaning solution to which he was exposed. He also has a cat in the home environment.  The cough has been productive of scant, slightly yellow sputum.  Taking a hot shower has helped. He is also been employing nasal hygiene.  He has no history of asthma. He has never smoked      Review of Systems  He has had no significant extrinsic symptoms such as itchy, watery eyes, or sneezing. He also denies fever, chills, or sweats.  There's been no significant sore throat, dental pain, otic pain, or otic discharge.     Objective:   Physical Exam  General appearance: thin but good health ;well nourished; no acute distress or increased work of breathing is present.  No  lymphadenopathy about the head, neck, or axilla noted.   Eyes: No conjunctival inflammation or lid edema is present.  Ears:  External ear exam shows no significant lesions or deformities.  Otoscopic examination reveals clear canals, tympanic membranes are intact bilaterally without bulging, retraction, inflammation or discharge.  Nose:  External nasal examination shows no deformity or inflammation. Nasal mucosa are pink and moist without lesions or exudates. Septal perforation   Oral exam: Dental hygiene is good; lips and gums are healthy appearing.There is no oropharyngeal erythema or exudate noted.   Neck:  No deformities, masses, or tenderness noted.     Heart:  Normal rate and regular rhythm. S1 and S2 normal without gallop, murmur, click, rub or other extra sounds.   Lungs:Chest clear to auscultation; no wheezes, rhonchi,rales ,or rubs present.No increased work of breathing.    Extremities:  No  cyanosis, edema, or clubbing  noted    Skin: Warm & dry .         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

## 2012-11-25 NOTE — Patient Instructions (Addendum)
Advair  one inhalation every 12 hours; gargle and spit after use.Fill antibiotic for  fever; discolored nasal or chest secretions; or frontal headache or facial  pain.

## 2013-01-07 ENCOUNTER — Other Ambulatory Visit: Payer: Self-pay

## 2013-03-12 ENCOUNTER — Telehealth: Payer: Self-pay

## 2013-03-12 NOTE — Telephone Encounter (Signed)
Medication List and allergies:  Reviewed and updated  90 day supply/mail order: na Local prescriptions: [facility] [address] [facility]  Immunizations due: UTD  A/P:   No changes to FH or PSH or personal hx Flu vaccine--11/2012 Tdap--2007 CCS--2007 PSA--01/2009--1.67  To Discuss with Provider: Not at this time

## 2013-03-12 NOTE — Addendum Note (Signed)
Addended by: Wende MottFOSTER, Suhani Stillion H on: 03/12/2013 03:42 PM   Modules accepted: Orders

## 2013-03-12 NOTE — Telephone Encounter (Signed)
Left message for call back Non identifiable  Tdap--2007 PSA--01/2009--1.67

## 2013-03-12 NOTE — Telephone Encounter (Signed)
Unable to talk at work

## 2013-03-15 ENCOUNTER — Encounter: Payer: Self-pay | Admitting: Internal Medicine

## 2013-03-15 ENCOUNTER — Ambulatory Visit (INDEPENDENT_AMBULATORY_CARE_PROVIDER_SITE_OTHER): Payer: PRIVATE HEALTH INSURANCE | Admitting: Internal Medicine

## 2013-03-15 VITALS — BP 99/62 | HR 66 | Temp 98.3°F | Ht 71.0 in | Wt 174.6 lb

## 2013-03-15 DIAGNOSIS — Z0000 Encounter for general adult medical examination without abnormal findings: Secondary | ICD-10-CM

## 2013-03-15 DIAGNOSIS — E785 Hyperlipidemia, unspecified: Secondary | ICD-10-CM

## 2013-03-15 DIAGNOSIS — J309 Allergic rhinitis, unspecified: Secondary | ICD-10-CM

## 2013-03-15 LAB — BASIC METABOLIC PANEL
BUN: 17 mg/dL (ref 6–23)
CALCIUM: 9.4 mg/dL (ref 8.4–10.5)
CO2: 28 mEq/L (ref 19–32)
Chloride: 110 mEq/L (ref 96–112)
Creatinine, Ser: 0.9 mg/dL (ref 0.4–1.5)
GFR: 88.87 mL/min (ref >60.00)
GLUCOSE: 79 mg/dL (ref 70–99)
POTASSIUM: 3.8 meq/L (ref 3.5–5.1)
SODIUM: 145 meq/L (ref 135–145)

## 2013-03-15 LAB — CBC WITH DIFFERENTIAL/PLATELET
BASOS ABS: 0 10*3/uL (ref 0.0–0.1)
BASOS PCT: 0.4 % (ref 0.0–3.0)
EOS PCT: 10.3 % — AB (ref 0.0–5.0)
Eosinophils Absolute: 0.7 10*3/uL (ref 0.0–0.7)
HEMATOCRIT: 42.3 % (ref 39.0–52.0)
HEMOGLOBIN: 14 g/dL (ref 13.0–17.0)
LYMPHS ABS: 1.5 10*3/uL (ref 0.7–4.0)
LYMPHS PCT: 21.4 % (ref 12.0–46.0)
MCHC: 33.1 g/dL (ref 30.0–36.0)
MCV: 90.8 fl (ref 78.0–100.0)
MONOS PCT: 5.3 % (ref 3.0–12.0)
Monocytes Absolute: 0.4 10*3/uL (ref 0.1–1.0)
NEUTROS ABS: 4.4 10*3/uL (ref 1.4–7.7)
Neutrophils Relative %: 62.6 % (ref 43.0–77.0)
Platelets: 318 10*3/uL (ref 150.0–400.0)
RBC: 4.66 Mil/uL (ref 4.22–5.81)
RDW: 13.1 % (ref 11.5–14.6)
WBC: 7 10*3/uL (ref 4.5–10.5)

## 2013-03-15 LAB — HEPATIC FUNCTION PANEL
ALK PHOS: 47 U/L (ref 39–117)
ALT: 25 U/L (ref 0–53)
AST: 23 U/L (ref 0–37)
Albumin: 4.4 g/dL (ref 3.5–5.2)
Bilirubin, Direct: 0 mg/dL (ref 0.0–0.3)
Total Bilirubin: 0.7 mg/dL (ref 0.3–1.2)
Total Protein: 7.3 g/dL (ref 6.0–8.3)

## 2013-03-15 LAB — LIPID PANEL
Cholesterol: 211 mg/dL — ABNORMAL HIGH (ref 0–200)
HDL: 64.3 mg/dL (ref >39.00)
Total CHOL/HDL Ratio: 3
Triglycerides: 70 mg/dL (ref 0.0–149.0)
VLDL: 14 mg/dL (ref 0.0–40.0)

## 2013-03-15 LAB — TSH: TSH: 0.54 u[IU]/mL (ref 0.35–5.50)

## 2013-03-15 LAB — LDL CHOLESTEROL, DIRECT: LDL DIRECT: 127.6 mg/dL

## 2013-03-15 MED ORDER — PRAVASTATIN SODIUM 10 MG PO TABS: ORAL_TABLET | ORAL | Status: DC

## 2013-03-15 NOTE — Progress Notes (Signed)
   Subjective:    Patient ID: [name] [name], male    DOB: 11/19/1955, 58 y.o.   MRN: [id number]  HPI  He is here for a physical;acute issues denied.     Review of Systems A heart healthy diet is followed; only exercise seasonally as biking in Spring - Fall. Family history is negative for premature coronary disease. Advanced cholesterol testing reveals  LDL goal is less than 140 ; ideally < 110 . There is medication compliance with the statin.  Low dose ASA taken Specifically denied are  chest pain, palpitations, dyspnea, or claudication.  Significant abdominal symptoms, memory deficit, or myalgias not present.     Objective:   Physical Exam  Gen.: Healthy and well-nourished in appearance. Alert, appropriate and cooperative throughout exam.Appears younger than stated age  Head: Normocephalic without obvious abnormalities  Eyes: No corneal or conjunctival inflammation noted. Pupils equal round reactive to light and accommodation. Extraocular motion intact.  Ears: External  ear exam reveals no significant lesions or deformities. Canals clear .TMs normal.  Nose: External nasal exam reveals no deformity or inflammation. Nasal mucosa are pink and moist. No lesions or exudates noted.   Mouth: Oral mucosa and oropharynx reveal no lesions or exudates. Teeth in good repair. Neck: No deformities, masses, or tenderness noted. Range of motion & Thyroid normal. Lungs: Normal respiratory effort; chest expands symmetrically. Lungs are clear to auscultation without rales, wheezes, or increased work of breathing. Heart: Normal rate and rhythm. Normal S1 and S2. No gallop, click, or rub.No murmur. Abdomen: Bowel sounds normal; abdomen soft and nontender. No masses, organomegaly or hernias noted. Genitalia: Genitalia normal except for left varices. Prostate is normal without enlargement, asymmetry, nodularity, or induration .                                  Musculoskeletal/extremities: No deformity or  scoliosis noted of  the thoracic or lumbar spine.  No clubbing, cyanosis, edema, or significant extremity  deformity noted. Range of motion normal .Tone & strength normal. Hand joints normal . Fingernail health good. Able to lie down & sit up w/o help. Negative SLR bilaterally Vascular: Carotid, radial artery, dorsalis pedis and  posterior tibial pulses are full and equal. No bruits present. Neurologic: Alert and oriented x3. Deep tendon reflexes symmetrical and normal.        Skin: Intact without suspicious lesions or rashes. Lymph: No cervical, axillary lymphadenopathy present. Psych: Mood and affect are normal. Normally interactive                                                                                        Assessment & Plan:  #1 comprehensive physical exam; no acute findings  Plan: see Orders  & Recommendations

## 2013-03-15 NOTE — Patient Instructions (Signed)
Your next office appointment will be determined based upon review of your pending labs . Those instructions will be transmitted to you through My Chart. Cardiovascular exercise, this can be as simple a program as walking, is recommended 30-45 minutes 3-4 times per week. If you're not exercising you should take 6-8 weeks to build up to this level.  

## 2013-03-15 NOTE — Progress Notes (Signed)
Pre visit review using our clinic review tool, if applicable. No additional management support is needed unless otherwise documented below in the visit note. 

## 2013-05-19 ENCOUNTER — Telehealth: Payer: Self-pay | Admitting: *Deleted

## 2013-05-19 ENCOUNTER — Other Ambulatory Visit: Payer: Self-pay | Admitting: *Deleted

## 2013-05-19 MED ORDER — HYDROCORTISONE ACE-PRAMOXINE 1-1 % RE FOAM: 1.0000 | Freq: Two times a day (BID) | RECTAL | Status: DC

## 2013-05-19 NOTE — Telephone Encounter (Signed)
rx sent pt aware 

## 2013-05-19 NOTE — Telephone Encounter (Signed)
Proctofoam HC Ok X1

## 2013-05-19 NOTE — Telephone Encounter (Signed)
Pt called requesting a Proctofoam refill.  Medication not listed on med list.  Please advise

## 2013-12-17 ENCOUNTER — Other Ambulatory Visit: Payer: Self-pay

## 2014-03-18 ENCOUNTER — Other Ambulatory Visit: Payer: Self-pay | Admitting: Internal Medicine

## 2014-03-21 ENCOUNTER — Other Ambulatory Visit (INDEPENDENT_AMBULATORY_CARE_PROVIDER_SITE_OTHER): Payer: PRIVATE HEALTH INSURANCE

## 2014-03-21 ENCOUNTER — Encounter: Payer: Self-pay | Admitting: Internal Medicine

## 2014-03-21 ENCOUNTER — Ambulatory Visit (INDEPENDENT_AMBULATORY_CARE_PROVIDER_SITE_OTHER): Payer: PRIVATE HEALTH INSURANCE | Admitting: Internal Medicine

## 2014-03-21 VITALS — BP 116/72 | HR 74 | Temp 97.7°F | Resp 14 | Ht 71.0 in | Wt 175.5 lb

## 2014-03-21 DIAGNOSIS — Z0189 Encounter for other specified special examinations: Secondary | ICD-10-CM

## 2014-03-21 DIAGNOSIS — J309 Allergic rhinitis, unspecified: Secondary | ICD-10-CM

## 2014-03-21 DIAGNOSIS — E785 Hyperlipidemia, unspecified: Secondary | ICD-10-CM

## 2014-03-21 DIAGNOSIS — Z0000 Encounter for general adult medical examination without abnormal findings: Secondary | ICD-10-CM

## 2014-03-21 LAB — BASIC METABOLIC PANEL
BUN: 16 mg/dL (ref 6–23)
CHLORIDE: 105 meq/L (ref 96–112)
CO2: 27 mEq/L (ref 19–32)
Calcium: 9.5 mg/dL (ref 8.4–10.5)
Creatinine, Ser: 1.01 mg/dL (ref 0.40–1.50)
GFR: 80.51 mL/min (ref >60.00)
Glucose, Bld: 100 mg/dL — ABNORMAL HIGH (ref 70–99)
Potassium: 4 mEq/L (ref 3.5–5.1)
Sodium: 139 mEq/L (ref 135–145)

## 2014-03-21 LAB — HEPATIC FUNCTION PANEL
ALT: 23 U/L (ref 0–53)
AST: 20 U/L (ref 0–37)
Albumin: 4.3 g/dL (ref 3.5–5.2)
Alkaline Phosphatase: 51 U/L (ref 39–117)
BILIRUBIN DIRECT: 0.1 mg/dL (ref 0.0–0.3)
TOTAL PROTEIN: 6.8 g/dL (ref 6.0–8.3)
Total Bilirubin: 0.5 mg/dL (ref 0.2–1.2)

## 2014-03-21 LAB — CBC WITH DIFFERENTIAL/PLATELET
BASOS ABS: 0 10*3/uL (ref 0.0–0.1)
Basophils Relative: 0.5 % (ref 0.0–3.0)
Eosinophils Absolute: 1.2 10*3/uL — ABNORMAL HIGH (ref 0.0–0.7)
HCT: 44.3 % (ref 39.0–52.0)
Hemoglobin: 14.6 g/dL (ref 13.0–17.0)
LYMPHS PCT: 19.6 % (ref 12.0–46.0)
Lymphs Abs: 1.5 10*3/uL (ref 0.7–4.0)
MCHC: 32.9 g/dL (ref 30.0–36.0)
MCV: 91 fl (ref 78.0–100.0)
MONOS PCT: 4.7 % (ref 3.0–12.0)
Monocytes Absolute: 0.4 10*3/uL (ref 0.1–1.0)
Neutro Abs: 4.6 10*3/uL (ref 1.4–7.7)
Neutrophils Relative %: 60.1 % (ref 43.0–77.0)
Platelets: 346 10*3/uL (ref 150.0–400.0)
RBC: 4.86 Mil/uL (ref 4.22–5.81)
RDW: 12.9 % (ref 11.5–15.5)
WBC: 7.7 10*3/uL (ref 4.0–10.5)

## 2014-03-21 LAB — LIPID PANEL
CHOL/HDL RATIO: 3
Cholesterol: 196 mg/dL (ref 0–200)
HDL: 56.3 mg/dL (ref >39.00)
LDL Cholesterol: 123 mg/dL — ABNORMAL HIGH (ref 0–99)
NonHDL: 139.7
Triglycerides: 85 mg/dL (ref 0.0–149.0)
VLDL: 17 mg/dL (ref 0.0–40.0)

## 2014-03-21 LAB — TSH: TSH: 3.71 u[IU]/mL (ref 0.35–4.50)

## 2014-03-21 MED ORDER — PRAVASTATIN SODIUM 10 MG PO TABS: 10.0000 mg | ORAL_TABLET | Freq: Every day | ORAL | Status: DC

## 2014-03-21 NOTE — Patient Instructions (Addendum)
Your next office appointment will be determined based upon review of your pending labs . Those instructions will be transmitted to you through My Chart   Plain Mucinex (NOT D) for thick secretions ;force NON dairy fluids .   Nasal cleansing in the shower as discussed with lather of mild shampoo.After 10 seconds wash off lather while  exhaling through nostrils. Make sure that all residual soap is removed to prevent irritation.  Flonase OR Nasacort AQ 1 spray in each nostril twice a day as needed. Use the "crossover" technique into opposite nostril spraying toward opposite ear @ 45 degree angle, not straight up into nostril.  Plain Allegra (NOT D )  160 daily , Loratidine 10 mg , OR Zyrtec 10 mg @ bedtime  as needed for itchy eyes & sneezing.  Use soft tissue to remove the stool  and then cleanse the rectal area with a Tucks or Baby Wipe.

## 2014-03-21 NOTE — Progress Notes (Signed)
Subjective:    Patient ID: [name] [name], male    DOB: 02/12/1956, 59 y.o.   MRN: [id number]  HPI  He is here for a physical;acute issues include persistent nasal congestion.  He has been compliant with his statin without adverse effect. He is on a heart healthy diet. He cycles in the Summer;in Winter he walks 2-3 times per week for 30 minutes. He has no cardiopulmonary symptoms with exercise  Advanced cholesterol testing reveals his LDL goal is less than 140, ideally less than 110. There is no family history of premature heart attack or stroke.     Review of Systems    He has had an issue with nasal congestion recently. He's been taking pseudoephedrine as needed intermittently over the last month. He averages taking it 1 time per week.  He has perennial allergies with seasonal flares. The Flonase is been of less benefit recently. He is also on ipratropium nasal spray 2 sprays up to 3 times a day.  Significant headaches, epistaxis, chest pain, palpitations, exertional dyspnea, claudication, paroxysmal nocturnal dyspnea, or edema absent.  No GI symptoms except rare trace of blood on tissue. No memory loss or myalgias.       Objective:   Physical Exam  Gen.: Thin but healthy and well-nourished in appearance. Alert, appropriate and cooperative throughout exam. Appears younger than stated age  Head: Normocephalic without obvious abnormalities.  Eyes: No corneal or conjunctival inflammation noted. Pupils equal round reactive to light and accommodation. Extraocular motion intact.  Ears: External  ear exam reveals no significant lesions or deformities. Canals clear .TMs normal. Hearing is grossly normal bilaterally. Nose: External nasal exam reveals no deformity or inflammation. Nasal septal perforation. No lesions or exudates noted.   Mouth: Oral mucosa and oropharynx reveal no lesions or exudates. Teeth in good repair. Neck: No deformities, masses, or tenderness noted. Range of motion &  Thyroid  normal. Lungs: Normal respiratory effort; chest expands symmetrically. Lungs are clear to auscultation without rales, wheezes, or increased work of breathing. Heart: Normal rate and rhythm. Normal S1 and S2. No gallop, click, or rub. No murmur. Abdomen: Bowel sounds normal; abdomen soft and nontender. No masses, organomegaly or hernias noted. Genitalia: Genitalia normal except for left varices. Prostate is normal without enlargement, asymmetry, nodularity, or induration                                  Musculoskeletal/extremities: No deformity or scoliosis noted of  the thoracic or lumbar spine.  No clubbing, cyanosis, edema, or significant extremity  deformity noted.  Range of motion normal . Tone & strength normal. Hand joints normal.  Fingernail health good. Able to lie down & sit up w/o help.  Negative SLR bilaterally Vascular: Carotid, radial artery, dorsalis pedis and  posterior tibial pulses are full and equal. No bruits present. Neurologic: Alert and oriented x3. Deep tendon reflexes symmetrical and normal.  Gait normal     Skin: Intact without suspicious lesions or rashes. Lymph: No cervical, axillary, or inguinal lymphadenopathy present. Psych: Mood and affect are normal. Normally interactive  Assessment & Plan:  #1 comprehensive physical exam; no acute findings  Plan: see Orders  & Recommendations

## 2014-03-21 NOTE — Progress Notes (Signed)
Pre visit review using our clinic review tool, if applicable. No additional management support is needed unless otherwise documented below in the visit note. 

## 2014-03-23 ENCOUNTER — Other Ambulatory Visit (INDEPENDENT_AMBULATORY_CARE_PROVIDER_SITE_OTHER): Payer: PRIVATE HEALTH INSURANCE

## 2014-03-23 ENCOUNTER — Telehealth: Payer: Self-pay

## 2014-03-23 DIAGNOSIS — R739 Hyperglycemia, unspecified: Secondary | ICD-10-CM

## 2014-03-23 LAB — HEMOGLOBIN A1C: Hgb A1c MFr Bld: 6 % (ref 4.6–6.5)

## 2014-03-23 NOTE — Telephone Encounter (Signed)
Request for add on has been faxed to lab 

## 2014-03-23 NOTE — Telephone Encounter (Signed)
-----   Message from Pecola LawlessWilliam F Hopper, MD sent at 03/23/2014  6:49 AM EST ----- Please add A1c (R73.9)

## 2014-07-27 ENCOUNTER — Telehealth: Payer: Self-pay | Admitting: Internal Medicine

## 2014-07-27 NOTE — Telephone Encounter (Signed)
Pt called waiting a CPE only, no a New Patient appt. Spoke with nurse and she said it should be fine since he is a transfer [facility] and does not have a lot of health issues

## 2014-09-18 ENCOUNTER — Encounter: Payer: Self-pay | Admitting: Internal Medicine

## 2014-09-20 ENCOUNTER — Encounter: Payer: Self-pay | Admitting: Internal Medicine

## 2014-09-20 ENCOUNTER — Ambulatory Visit (INDEPENDENT_AMBULATORY_CARE_PROVIDER_SITE_OTHER): Payer: PRIVATE HEALTH INSURANCE | Admitting: Internal Medicine

## 2014-09-20 VITALS — BP 136/86 | HR 65 | Temp 97.7°F | Resp 14 | Wt 178.0 lb

## 2014-09-20 DIAGNOSIS — B369 Superficial mycosis, unspecified: Secondary | ICD-10-CM | POA: Diagnosis not present

## 2014-09-20 DIAGNOSIS — R7301 Impaired fasting glucose: Secondary | ICD-10-CM | POA: Diagnosis not present

## 2014-09-20 MED ORDER — KETOCONAZOLE 2 % EX CREA: 1.0000 "application " | TOPICAL_CREAM | Freq: Two times a day (BID) | CUTANEOUS | Status: DC

## 2014-09-20 NOTE — Telephone Encounter (Signed)
Patient called regarding email. I went ahead and scheduled an appointment for him this morning

## 2014-09-20 NOTE — Patient Instructions (Signed)
  Please change your underwear several times a day to keep tissues as dry as possible. After applying the cream  use a hairdryer to blow the tissues dry. Also do this after any activities which cause sweating.

## 2014-09-20 NOTE — Progress Notes (Signed)
Pre visit review using our clinic review tool, if applicable. No additional management support is needed unless otherwise documented below in the visit note. 

## 2014-09-20 NOTE — Progress Notes (Signed)
   Subjective:    Patient ID: [name] [name], male    DOB: 04/20/1955, 59 y.o.   MRN: [id number]  HPI  Over the last 4 weeks he's had itching and moisture in the intragluteal area of the buttocks. He uses toilet paper and non alcohol Wipes .Tthere is residual redness even though the itching improved. He also has used cortisone topically, Vaseline, Gold Bond, & OTC diaper rash cream without resolution of symptoms.  Bowel movements are regular with no other GI symptoms.  He has no other rashes or extrinsic symptoms.  In January of this year fasting glucose was 100; A1c was 6%.  Review of Systems  Unexplained weight loss, abdominal pain, significant dyspepsia, dysphagia, melena, rectal bleeding, or persistently small caliber stools are denied.  No associated itchy, watery eyes. Swelling of the lips or tongue denied. Shortness of breath, wheezing, or cough absent. Fever ,chills , or sweats denied. Purulence absent. Diarrhea not present. No rash of feet.     Objective:   Physical Exam  Pertinent or positive findings include: There is extensive mild erythema in the intragluteal area of the buttocks without maceration. He has no external hemorrhoids. Genital exam reveals varices on the left and granuloma from previous vasectomy.  General appearance :adequately nourished; in no distress.  Eyes: No conjunctival inflammation or scleral icterus is present.  Oral exam:  Lips and gums are healthy appearing.There is no oropharyngeal erythema or exudate noted. Dental hygiene is good.  Heart:  Normal rate and regular rhythm. S1 and S2 normal without gallop, murmur, click, rub or other extra sounds    Lungs:Chest clear to auscultation; no wheezes, rhonchi,rales ,or rubs present.No increased work of breathing.   Abdomen: bowel sounds normal, soft and non-tender without masses, organomegaly or hernias noted.  No guarding or rebound.   Vascular : all pulses equal ; no bruits present.  Skin:Warm &  dry; no tenting or jaundice   Lymphatic: No lymphadenopathy is noted about the head, neck, axilla, or inguinal areas.   Neuro: Strength, tone normal.         Assessment & Plan:  #1 fungal dermatitis suggested in intergluteal area  #2 hyperglycemia with normal A1c  Plan: See orders recommendations

## 2014-09-22 ENCOUNTER — Telehealth: Payer: Self-pay | Admitting: Internal Medicine

## 2014-09-22 NOTE — Telephone Encounter (Signed)
Avoid excess sweets & white carbohydrates (potatoes, rice, bread, and pasta). These can cause a high spike of the sugar level which stays elevated for a significant period of time (called sugar"load").  For example a  baked potato has a cup of sugar and a  french fry  2 teaspoons of sugar.  More complex carbs such as yams, wild  rice, whole grained bread &  wheat pasta have been much lower spike and persistent load of sugar than the white carbs.

## 2014-09-22 NOTE — Telephone Encounter (Signed)
Please advise 

## 2014-09-22 NOTE — Telephone Encounter (Signed)
Wondering on diagnosis. Fasting hyperglycemia. He states that Dr. Alwyn Ren never discussed this with him, so he is curious about why its on there. Can you please call him [phone]

## 2014-09-26 NOTE — Telephone Encounter (Signed)
LVM for pt to call back as soon as possible.   Also sent my chart message.

## 2014-10-07 ENCOUNTER — Encounter: Payer: Self-pay | Admitting: Internal Medicine

## 2014-10-07 ENCOUNTER — Telehealth: Payer: Self-pay | Admitting: Emergency Medicine

## 2014-10-07 MED ORDER — FLUCONAZOLE 100 MG PO TABS: 100.0000 mg | ORAL_TABLET | Freq: Every day | ORAL | Status: DC

## 2014-10-07 NOTE — Telephone Encounter (Signed)
Please advise 

## 2014-10-12 NOTE — Telephone Encounter (Signed)
Derm referral if rash persists after Diflucan

## 2014-10-12 NOTE — Telephone Encounter (Signed)
Please advise 

## 2014-10-23 ENCOUNTER — Encounter: Payer: Self-pay | Admitting: Internal Medicine

## 2014-10-24 NOTE — Telephone Encounter (Signed)
Please advise 

## 2014-10-24 NOTE — Telephone Encounter (Signed)
Pt left msg on triage requesting status on email...[name] [name]

## 2014-10-25 ENCOUNTER — Other Ambulatory Visit: Payer: Self-pay | Admitting: Internal Medicine

## 2014-10-25 DIAGNOSIS — R21 Rash and other nonspecific skin eruption: Secondary | ICD-10-CM

## 2015-03-20 ENCOUNTER — Encounter: Payer: Self-pay | Admitting: Internal Medicine

## 2015-03-20 ENCOUNTER — Ambulatory Visit (INDEPENDENT_AMBULATORY_CARE_PROVIDER_SITE_OTHER): Payer: BLUE CROSS/BLUE SHIELD | Admitting: Internal Medicine

## 2015-03-20 ENCOUNTER — Other Ambulatory Visit: Payer: Self-pay

## 2015-03-20 VITALS — BP 102/74 | HR 69 | Temp 97.6°F | Ht 71.0 in | Wt 174.0 lb

## 2015-03-20 DIAGNOSIS — Z0000 Encounter for general adult medical examination without abnormal findings: Secondary | ICD-10-CM

## 2015-03-20 DIAGNOSIS — Z114 Encounter for screening for human immunodeficiency virus [HIV]: Secondary | ICD-10-CM

## 2015-03-20 DIAGNOSIS — Z125 Encounter for screening for malignant neoplasm of prostate: Secondary | ICD-10-CM | POA: Diagnosis not present

## 2015-03-20 DIAGNOSIS — R7303 Prediabetes: Secondary | ICD-10-CM

## 2015-03-20 DIAGNOSIS — Z1211 Encounter for screening for malignant neoplasm of colon: Secondary | ICD-10-CM

## 2015-03-20 LAB — CBC WITH DIFFERENTIAL/PLATELET
BASOS PCT: 0.6 % (ref 0.0–3.0)
Basophils Absolute: 0 10*3/uL (ref 0.0–0.1)
EOS PCT: 13.2 % — AB (ref 0.0–5.0)
Eosinophils Absolute: 0.9 10*3/uL — ABNORMAL HIGH (ref 0.0–0.7)
HCT: 42.5 % (ref 39.0–52.0)
Hemoglobin: 14.1 g/dL (ref 13.0–17.0)
LYMPHS ABS: 1.4 10*3/uL (ref 0.7–4.0)
LYMPHS PCT: 20.5 % (ref 12.0–46.0)
MCHC: 33.1 g/dL (ref 30.0–36.0)
MCV: 91.3 fl (ref 78.0–100.0)
MONOS PCT: 5 % (ref 3.0–12.0)
Monocytes Absolute: 0.3 10*3/uL (ref 0.1–1.0)
NEUTROS ABS: 4 10*3/uL (ref 1.4–7.7)
Neutrophils Relative %: 60.7 % (ref 43.0–77.0)
PLATELETS: 332 10*3/uL (ref 150.0–400.0)
RBC: 4.66 Mil/uL (ref 4.22–5.81)
RDW: 13.5 % (ref 11.5–15.5)
WBC: 6.6 10*3/uL (ref 4.0–10.5)

## 2015-03-20 LAB — COMPREHENSIVE METABOLIC PANEL
ALBUMIN: 4.3 g/dL (ref 3.5–5.2)
ALK PHOS: 51 U/L (ref 39–117)
ALT: 22 U/L (ref 0–53)
AST: 19 U/L (ref 0–37)
BILIRUBIN TOTAL: 0.5 mg/dL (ref 0.2–1.2)
BUN: 17 mg/dL (ref 6–23)
CO2: 23 mEq/L (ref 19–32)
CREATININE: 0.9 mg/dL (ref 0.40–1.50)
Calcium: 9.3 mg/dL (ref 8.4–10.5)
Chloride: 106 mEq/L (ref 96–112)
GFR: 91.66 mL/min (ref >60.00)
Glucose, Bld: 86 mg/dL (ref 70–99)
Potassium: 4.5 mEq/L (ref 3.5–5.1)
Sodium: 140 mEq/L (ref 135–145)
Total Protein: 6.6 g/dL (ref 6.0–8.3)

## 2015-03-20 LAB — HEMOGLOBIN A1C: Hgb A1c MFr Bld: 5.7 % (ref 4.6–6.5)

## 2015-03-20 LAB — TSH: TSH: 1.36 u[IU]/mL (ref 0.35–4.50)

## 2015-03-20 LAB — PSA: PSA: 3.68 ng/mL (ref 0.10–4.00)

## 2015-03-20 LAB — LIPID PANEL
CHOLESTEROL: 192 mg/dL (ref 0–200)
HDL: 61 mg/dL (ref >39.00)
LDL Cholesterol: 120 mg/dL — ABNORMAL HIGH (ref 0–99)
NonHDL: 130.5
TRIGLYCERIDES: 55 mg/dL (ref 0.0–149.0)
Total CHOL/HDL Ratio: 3
VLDL: 11 mg/dL (ref 0.0–40.0)

## 2015-03-20 NOTE — Assessment & Plan Note (Signed)
Td 07 Had a flu shot Cscope normal 2007 per pt, next 11-2015 --- refer to Dr Leone PayorGessner  Prostate cancer screening: DRE normal, check a PSA Diet and exercise discussed CMP, FLP, CBC, A1c, TSH, PSA, HIV RTC 1 year

## 2015-03-20 NOTE — Patient Instructions (Signed)
BEFORE YOU LEAVE THE OFFICE:  GO TO THE LAB  Get the blood work    GO TO THE FRONT DESK Schedule a complete physical exam to be done in 1 year Please be fasting   

## 2015-03-20 NOTE — Progress Notes (Signed)
Pre visit review using our clinic review tool, if applicable. No additional management support is needed unless otherwise documented below in the visit note. 

## 2015-03-20 NOTE — Progress Notes (Signed)
Subjective:    Patient ID: [name] [name], male    DOB: 12/28/1955, 60 y.o.   MRN: [id number]  DOS:  03/20/2015 Type of visit - description : New patient, transferring from [facility]. [facility], [facility] Interval history: No major concerns, good compliance with cholesterol medication    Review of Systems  Constitutional: No fever. No chills. No unexplained wt changes. No unusual sweats  HEENT: No dental problems, no ear discharge, no facial swelling, no voice changes. No eye discharge, no eye  redness , no  intolerance to light   Respiratory: No wheezing , no  difficulty breathing. No cough , no mucus production  Cardiovascular: No CP, no leg swelling , no  Palpitations  GI: no nausea, no vomiting, no diarrhea , no  abdominal pain.  No blood in the stools. No dysphagia, no odynophagia    Endocrine: No polyphagia, no polyuria , no polydipsia  GU: No dysuria, gross hematuria, difficulty urinating. No urinary urgency, no frequency.  Musculoskeletal: No joint swellings or unusual aches or pains  Skin: No change in the color of the skin, palor , no  Rash  Allergic, immunologic: No environmental allergies , no  food allergies  Neurological: No dizziness no  syncope. No headaches. No diplopia, no slurred, no slurred speech, no motor deficits, no facial  Numbness  Hematological: No enlarged lymph nodes, no easy bruising , no unusual bleedings  Psychiatry: No suicidal ideas, no hallucinations, no beavior problems, no confusion.  No unusual/severe anxiety, no depression   Past Medical History  Diagnosis Date  . Hyperlipidemia   . Allergic rhinitis   . Nephrolithiasis      X 1  . 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 Vasectomy  1997  . Lithotripsy  2006?  [facility]. Eye surgery  2000?    PRK bilateral  . Colonoscopy  2007    negative; [place]  . Hernia repair  1995    [facility]  . Hernia repair  03/21/11    Lap BIH  w/mesh, Dr Abbey Chattersosenbower    Social History   Social History  . Marital Status: Married    Spouse Name: N/A  . Number of Children: 2  . Years of Education: N/A   Occupational History  . [employer] retired, now works at a [employer]    Social History Main Topics  . Smoking status: Never Smoker   . Smokeless tobacco: Never Used  . Alcohol Use: 0.0 oz/week    0 Standard drinks or equivalent per week     Comment: rare wine  . Drug Use: No  . Sexual Activity: Not on file   Other Topics Concern  . Not on file   Social History Narrative   2 children in college     Family History  Problem Relation Age of Onset  . Heart disease Mother     mitral valve prolapse  . Cancer Mother     uterine ; stage 1 melanoma of left shoulder   . Cancer Father     liver and pancreatic(NOT colon)  . Diabetes Father     due to pancreatic disease  . Hyperlipidemia Brother   . Diabetes Brother   . Hypertension Brother   . Stroke Neg Hx   . Alzheimer's disease Maternal Aunt   . Cancer Paternal Uncle     stomach   . Colon cancer Neg Hx   .  Prostate cancer Neg Hx       Medication List       This list is accurate as of: 03/20/15  5:58 PM.  Always use your most recent med list.               aspirin 81 MG tablet  Take 160 mg by mouth daily.     pravastatin 10 MG tablet  Commonly known as:  PRAVACHOL  Take 1 tablet (10 mg total) by mouth daily.     PROAIR HFA 108 (90 Base) MCG/ACT inhaler  Generic drug:  albuterol  Reported on 03/20/2015           Objective:   Physical Exam BP 102/74 mmHg  Pulse 69  Temp(Src) 97.6 F (36.4 C) (Oral)  Ht 5\' 11"  (1.803 m)  Wt 174 lb (78.926 kg)  BMI 24.28 kg/m2  SpO2 99% General:   Well developed, well nourished . NAD.  Neck:  no  thyromegaly , normal carotid pulse HEENT:  Normocephalic . Face symmetric, atraumatic Lungs:  CTA B Normal respiratory effort, no intercostal retractions, no accessory muscle use. Heart: RRR,  no murmur.  No  pretibial edema bilaterally  Abdomen:  Not distended, soft, non-tender. No rebound or rigidity Rectal:  External abnormalities: none. Normal sphincter tone. No rectal masses or tenderness.  Stool brown  Prostate: Prostate gland firm and smooth, no enlargement, nodularity, tenderness, mass, asymmetry or induration.  Skin: Exposed areas without rash. Not pale. Not jaundice Neurologic:  alert & oriented X3.  Speech normal, gait appropriate for age and unassisted Strength symmetric and appropriate for age.  Psych: Cognition and judgment appear intact.  Cooperative with normal attention span and concentration.  Behavior appropriate. No anxious or depressed appearing.    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

## 2015-03-21 LAB — HIV ANTIBODY (ROUTINE TESTING W REFLEX): HIV: NONREACTIVE

## 2015-03-29 ENCOUNTER — Telehealth: Payer: Self-pay | Admitting: Internal Medicine

## 2015-03-29 DIAGNOSIS — E785 Hyperlipidemia, unspecified: Secondary | ICD-10-CM

## 2015-03-29 MED ORDER — PRAVASTATIN SODIUM 10 MG PO TABS: 10.0000 mg | ORAL_TABLET | Freq: Every day | ORAL | Status: DC

## 2015-03-29 NOTE — Telephone Encounter (Signed)
Relation to pt: self Call back [phone]   Reason for call:  Patient stated he would like pravastatin (PRAVACHOL) 10 MG tablet please send to  [facility] [place], [place] - [address] [phone] (Phone) [phone] (Fax)

## 2015-03-29 NOTE — Telephone Encounter (Signed)
Rx sent 

## 2015-04-27 ENCOUNTER — Encounter: Payer: Self-pay | Admitting: Internal Medicine

## 2015-09-06 ENCOUNTER — Encounter: Payer: Self-pay | Admitting: Internal Medicine

## 2015-09-14 ENCOUNTER — Ambulatory Visit (AMBULATORY_SURGERY_CENTER): Payer: Self-pay | Admitting: *Deleted

## 2015-09-14 ENCOUNTER — Encounter: Payer: Self-pay | Admitting: Internal Medicine

## 2015-09-14 VITALS — Ht 70.0 in | Wt 178.8 lb

## 2015-09-14 DIAGNOSIS — Z1211 Encounter for screening for malignant neoplasm of colon: Secondary | ICD-10-CM

## 2015-09-14 NOTE — Progress Notes (Signed)
Patient denies any allergies to egg or soy products. Patient denies complications with anesthesia/sedation.  Patient denies oxygen use at home and denies diet medications. Emmi instructions for colonoscopy explained but patient denied.  Denied pamphlet.   

## 2015-09-28 ENCOUNTER — Ambulatory Visit (AMBULATORY_SURGERY_CENTER): Payer: BLUE CROSS/BLUE SHIELD | Admitting: Internal Medicine

## 2015-09-28 ENCOUNTER — Encounter: Payer: Self-pay | Admitting: Internal Medicine

## 2015-09-28 VITALS — BP 109/68 | HR 69 | Temp 98.0°F | Resp 21 | Ht 70.0 in | Wt 178.0 lb

## 2015-09-28 DIAGNOSIS — Z1211 Encounter for screening for malignant neoplasm of colon: Secondary | ICD-10-CM | POA: Diagnosis not present

## 2015-09-28 NOTE — Patient Instructions (Addendum)
No polyps!  Next routine colonoscopy in 10 years - 2027  You do have a condition called diverticulosis - common and not usually a problem. Please read the handout provided.  I appreciate the opportunity to care for you. Iva Boop, MD, [facility]   Discharge instructions given. Handout on diverticulosis. Resume previous medications. YOU HAD AN ENDOSCOPIC PROCEDURE TODAY AT THE [facility]:   Refer to the procedure report that was given to you for any specific questions about what was found during the examination.  If the procedure report does not answer your questions, please call your gastroenterologist to clarify.  If you requested that your care partner not be given the details of your procedure findings, then the procedure report has been included in a sealed envelope for you to review at your convenience later.  YOU SHOULD EXPECT: Some feelings of bloating in the abdomen. Passage of more gas than usual.  Walking can help get rid of the air that was put into your GI tract during the procedure and reduce the bloating. If you had a lower endoscopy (such as a colonoscopy or flexible sigmoidoscopy) you may notice spotting of blood in your stool or on the toilet paper. If you underwent a bowel prep for your procedure, you may not have a normal bowel movement for a few days.  Please Note:  You might notice some irritation and congestion in your nose or some drainage.  This is from the oxygen used during your procedure.  There is no need for concern and it should clear up in a day or so.  SYMPTOMS TO REPORT IMMEDIATELY:   Following lower endoscopy (colonoscopy or flexible sigmoidoscopy):  Excessive amounts of blood in the stool  Significant tenderness or worsening of abdominal pains  Swelling of the abdomen that is new, acute  Fever of 100F or higher   For urgent or emergent issues, a gastroenterologist can be reached at any hour by calling [phone].   DIET:  Your first meal following the procedure should be a small meal and then it is ok to progress to your normal diet. Heavy or fried foods are harder to digest and may make you feel nauseous or bloated.  Likewise, meals heavy in dairy and vegetables can increase bloating.  Drink plenty of fluids but you should avoid alcoholic beverages for 24 hours.  ACTIVITY:  You should plan to take it easy for the rest of today and you should NOT DRIVE or use heavy machinery until tomorrow (because of the sedation medicines used during the test).    FOLLOW UP: Our staff will call the number listed on your records the next business day following your procedure to check on you and address any questions or concerns that you may have regarding the information given to you following your procedure. If we do not reach you, we will leave a message.  However, if you are feeling well and you are not experiencing any problems, there is no need to return our call.  We will assume that you have returned to your regular daily activities without incident.  If any biopsies were taken you will be contacted by phone or by letter within the next 1-3 weeks.  Please call us at [phone] if you have not heard about the biopsies in 3 weeks.    SIGNATURES/CONFIDENTIALITY: You and/or your care partner have signed paperwork which will be entered into your electronic medical record.  These signatures attest to the fact  that that the information above on your After Visit Summary has been reviewed and is understood.  Full responsibility of the confidentiality of this discharge information lies with you and/or your care-partner.

## 2015-09-28 NOTE — Progress Notes (Signed)
Report to PACU, RN, vss, BBS= Clear.  

## 2015-09-28 NOTE — Op Note (Signed)
[facility] Patient Name: [name] [name] Procedure Date: 09/28/2015 2:46 PM MRN: [id number] Endoscopist: Iva Boop , MD Age: 60 Referring MD:  Date of Birth: 1955/08/17 Gender: Male Account #: [id number] Procedure:                Colonoscopy Indications:              Screening for colorectal malignant neoplasm (last                            colonoscopy was 10 years ago) Medicines:                Propofol per Anesthesia, Monitored Anesthesia Care Procedure:                Pre-Anesthesia Assessment:                           - Prior to the procedure, a History and Physical                            was performed, and patient medications and                            allergies were reviewed. The patient's tolerance of                            previous anesthesia was also reviewed. The risks                            and benefits of the procedure and the sedation                            options and risks were discussed with the patient.                            All questions were answered, and informed consent                            was obtained. Prior Anticoagulants: The patient has                            taken no previous anticoagulant or antiplatelet                            agents. ASA Grade Assessment: II - A patient with                            mild systemic disease. After reviewing the risks                            and benefits, the patient was deemed in                            satisfactory condition to undergo the procedure.  After obtaining informed consent, the colonoscope                            was passed under direct vision. Throughout the                            procedure, the patient's blood pressure, pulse, and                            oxygen saturations were monitored continuously. The                            Model CF-HQ190L [id number]) scope was introduced                            through  the anus and advanced to the the cecum,                            identified by appendiceal orifice and ileocecal                            valve. The ileocecal valve, appendiceal orifice,                            and rectum were photographed. The quality of the                            bowel preparation was good. The bowel preparation                            used was Miralax. Scope In: 2:49:35 PM Scope Out: 2:58:57 PM Scope Withdrawal Time: 0 hours 7 minutes 18 seconds  Total Procedure Duration: 0 hours 9 minutes 22 seconds  Findings:                 The perianal and digital rectal examinations were                            normal. Pertinent negatives include normal prostate                            (size, shape, and consistency).                           Multiple diverticula were found in the sigmoid                            colon. There was no evidence of diverticular                            bleeding.                           The exam was otherwise without abnormality on  direct and retroflexion views. Complications:            No immediate complications. Estimated blood loss:                            None. Estimated Blood Loss:     Estimated blood loss: none. Impression:               - Severe diverticulosis in the sigmoid colon. There                            was no evidence of diverticular bleeding.                           - The examination was otherwise normal on direct                            and retroflexion views.                           - No specimens collected. Recommendation:           - Repeat colonoscopy in 10 years for screening                            purposes.                           - Resume previous diet.                           - Continue present medications.                           - Patient has a contact number available for                            emergencies. The signs and symptoms of  potential                            delayed complications were discussed with the                            patient. Return to normal activities tomorrow.                            Written discharge instructions were provided to the                            patient. Iva Boop, MD 09/28/2015 3:02:55 PM This report has been signed electronically.

## 2015-09-29 ENCOUNTER — Telehealth: Payer: Self-pay | Admitting: *Deleted

## 2015-09-29 NOTE — Telephone Encounter (Signed)
  Follow up Call-  Call back number 09/28/2015  Post procedure Call Back phone  # #[phone] cell  Permission to leave phone message Yes  Some recent data might be hidden     Patient questions:  Do you have a fever, pain , or abdominal swelling? No. Pain Score  0 *  Have you tolerated food without any problems? Yes.    Have you been able to return to your normal activities? Yes  Do you have any questions about your discharge instructions: Diet   No. Medications  No. Follow up visit  No.  Do you have questions or concerns about your Care? No.  Actions: * If pain score is 4 or above: No action needed, pain <4.

## 2015-10-05 ENCOUNTER — Telehealth: Payer: Self-pay | Admitting: Behavioral Health

## 2015-10-05 ENCOUNTER — Ambulatory Visit (INDEPENDENT_AMBULATORY_CARE_PROVIDER_SITE_OTHER): Payer: BLUE CROSS/BLUE SHIELD | Admitting: Behavioral Health

## 2015-10-05 DIAGNOSIS — Z23 Encounter for immunization: Secondary | ICD-10-CM

## 2015-10-05 NOTE — Telephone Encounter (Signed)
Tdap , thank you

## 2015-10-05 NOTE — Progress Notes (Signed)
Pre visit review using our clinic review tool, if applicable. No additional management support is needed unless otherwise documented below in the visit note.  Patient in clinic today for Tdap vaccination. IM given in Left Deltoid. Patient tolerated injection well. 

## 2015-10-05 NOTE — Telephone Encounter (Signed)
Patient is scheduled for nurse visit today at 4 PM for tetanus. Is it ok for him to have vaccine and if so, does he need plain tetanus or Tdap? Thanks.

## 2016-03-18 ENCOUNTER — Encounter: Payer: Self-pay | Admitting: Internal Medicine

## 2016-03-18 ENCOUNTER — Ambulatory Visit (INDEPENDENT_AMBULATORY_CARE_PROVIDER_SITE_OTHER): Payer: BLUE CROSS/BLUE SHIELD | Admitting: Internal Medicine

## 2016-03-18 VITALS — BP 124/78 | HR 72 | Temp 97.8°F | Resp 14 | Ht 70.0 in | Wt 175.5 lb

## 2016-03-18 DIAGNOSIS — Z0000 Encounter for general adult medical examination without abnormal findings: Secondary | ICD-10-CM

## 2016-03-18 LAB — BASIC METABOLIC PANEL
BUN: 16 mg/dL (ref 6–23)
CALCIUM: 9.4 mg/dL (ref 8.4–10.5)
CO2: 30 mEq/L (ref 19–32)
Chloride: 106 mEq/L (ref 96–112)
Creatinine, Ser: 1.05 mg/dL (ref 0.40–1.50)
GFR: 76.46 mL/min (ref >60.00)
GLUCOSE: 94 mg/dL (ref 70–99)
Potassium: 4.3 mEq/L (ref 3.5–5.1)
Sodium: 140 mEq/L (ref 135–145)

## 2016-03-18 LAB — AST: AST: 18 U/L (ref 0–37)

## 2016-03-18 LAB — TSH: TSH: 2.94 u[IU]/mL (ref 0.35–4.50)

## 2016-03-18 LAB — HEPATITIS C ANTIBODY: HCV Ab: NEGATIVE

## 2016-03-18 LAB — PSA: PSA: 2.45 ng/mL (ref 0.10–4.00)

## 2016-03-18 LAB — HEMOGLOBIN A1C: HEMOGLOBIN A1C: 5.8 % (ref 4.6–6.5)

## 2016-03-18 LAB — LIPID PANEL
CHOL/HDL RATIO: 4
Cholesterol: 222 mg/dL — ABNORMAL HIGH (ref 0–200)
HDL: 60.1 mg/dL (ref >39.00)
LDL CALC: 145 mg/dL — AB (ref 0–99)
NONHDL: 161.64
Triglycerides: 81 mg/dL (ref 0.0–149.0)
VLDL: 16.2 mg/dL (ref 0.0–40.0)

## 2016-03-18 LAB — ALT: ALT: 22 U/L (ref 0–53)

## 2016-03-18 NOTE — Progress Notes (Signed)
Pre visit review using our clinic review tool, if applicable. No additional management support is needed unless otherwise documented below in the visit note. 

## 2016-03-18 NOTE — Patient Instructions (Signed)
GO TO THE LAB : Get the blood work     GO TO THE FRONT DESK Schedule your next appointment for a  Physical, fasting in 1 year

## 2016-03-18 NOTE — Assessment & Plan Note (Addendum)
Td 2017; Had a flu shot; discussed shingles immunization, elected to wait for now Cscope normal 2007 per pt, Cscope 09-2015 ---tics, 10 years   Prostate cancer screening: DRE and PSA wnl 2017, PSA was in the upper side of normal, will recheck a PSA. Diet and exercise discussed Labs: BMP, AST, ALT, FLP, A1c, TSH, PSA, hep C RTC 1 year

## 2016-03-18 NOTE — Progress Notes (Signed)
Subjective:    Patient ID: [name] [name], male    DOB: 10-18-1955, 61 y.o.   MRN: [id number]  DOS:  03/18/2016 Type of visit - description : cpx Interval history: feels well    Review of Systems Constitutional: No fever. No chills. No unexplained wt changes. No unusual sweats  HEENT: No dental problems, no ear discharge, no facial swelling, no voice changes. No eye discharge, no eye  redness , no  intolerance to light   Respiratory: No wheezing , no  difficulty breathing. No cough , no mucus production  Cardiovascular: No CP, no leg swelling , no  Palpitations  GI: no nausea, no vomiting, no diarrhea , no  abdominal pain.  No blood in the stools. No dysphagia, no odynophagia    Endocrine: No polyphagia, no polyuria , no polydipsia  GU: No dysuria, gross hematuria, difficulty urinating. No urinary urgency, no frequency.  Musculoskeletal: No joint swellings or unusual aches or pains  Skin: No change in the color of the skin, palor , no  Rash  Allergic, immunologic: No environmental allergies , no  food allergies  Neurological: No dizziness no  syncope. No headaches. No diplopia, no slurred, no slurred speech, no motor deficits, no facial  Numbness  Hematological: No enlarged lymph nodes, no easy bruising , no unusual bleedings  Psychiatry: No suicidal ideas, no hallucinations, no beavior problems, no confusion.  No unusual/severe anxiety, no depression    Past Medical History:  Diagnosis Date  . Allergic rhinitis   . Allergy   . Bronchitis   . History of kidney stones   . Hyperlipidemia   . 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 COLONOSCOPY  11/2005   Normal report/Gessner  . EYE SURGERY  2000   PRK bilateral  . HERNIA REPAIR  1995   RIH  . HERNIA REPAIR  03/21/11   Lap BIH w/mesh, Dr Abbey Chatters  . LITHOTRIPSY  2006  . TONSILLECTOMY AND ADENOIDECTOMY  1963  . VASECTOMY  1997     Social History   Social History  . Marital status: Married    Spouse name: N/A  . Number of children: 2  . Years of education: N/A   Occupational History  . Dossie Arbour retired, now works at a [employer] History Main Topics  . Smoking status: Never Smoker  . Smokeless tobacco: Never Used  . Alcohol use 0.6 oz/week    1 Glasses of wine per week     Comment: rare wine  . Drug use: No  . Sexual activity: Not on file   Other Topics Concern  . Not on file   Social History Narrative   2 children in college     Family History  Problem Relation Age of Onset  . Heart disease Mother     mitral valve prolapse  . Cancer Mother     uterine ; stage 1 melanoma of left shoulder   . Dementia Mother   . Cancer Father     liver and pancreatic(NOT colon)  . Diabetes Father     due to pancreatic disease  . Pancreatic cancer Father   . Hyperlipidemia Brother   . Diabetes Brother   . Hypertension Brother   . Alzheimer's disease Maternal Aunt   . Cancer Paternal Uncle     stomach   . Stroke Neg Hx   . Colon  cancer Neg Hx   . Prostate cancer Neg Hx   . Esophageal cancer Neg Hx   . Rectal cancer Neg Hx   . Stomach cancer Neg Hx     Allergies as of 03/18/2016   No Known Allergies     Medication List       Accurate as of 03/18/16 11:59 PM. Always use your most recent med list.          aspirin 81 MG tablet Take 160 mg by mouth daily.   budesonide-formoterol 80-4.5 MCG/ACT inhaler Commonly known as:  SYMBICORT Inhale 2 puffs into the lungs 2 (two) times daily as needed.   cetirizine 10 MG tablet Commonly known as:  ZYRTEC Take 10 mg by mouth daily.   pravastatin 10 MG tablet Commonly known as:  PRAVACHOL Take 1 tablet (10 mg total) by mouth daily.   PROAIR HFA 108 (90 Base) MCG/ACT inhaler Generic drug:  albuterol Reported on 09/14/2015          Objective:   Physical Exam BP 124/78 (BP Location: Left Arm, Patient Position: Sitting, Cuff Size: Normal)    Pulse 72   Temp 97.8 F (36.6 C) (Oral)   Resp 14   Ht 5\' 10"  (1.778 m)   Wt 175 lb 8 oz (79.6 kg)   SpO2 98%   BMI 25.18 kg/m  General:   Well developed, well nourished . NAD.  Neck: No  thyromegaly  HEENT:  Normocephalic . Face symmetric, atraumatic Lungs:  CTA B Normal respiratory effort, no intercostal retractions, no accessory muscle use. Heart: RRR,  no murmur.  No pretibial edema bilaterally  Abdomen:  Not distended, soft, non-tender. No rebound or rigidity.   Skin: Exposed areas without rash. Not pale. Not jaundice Neurologic:  alert & oriented X3.  Speech normal, gait appropriate for age and unassisted Strength symmetric and appropriate for age.  Psych: Cognition and judgment appear intact.  Cooperative with normal attention span and concentration.  Behavior appropriate. No anxious or depressed appearing.     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

## 2016-03-19 ENCOUNTER — Telehealth: Payer: Self-pay

## 2016-03-19 DIAGNOSIS — Z09 Encounter for follow-up examination after completed treatment for conditions other than malignant neoplasm: Secondary | ICD-10-CM | POA: Insufficient documentation

## 2016-03-19 NOTE — Telephone Encounter (Signed)
LMOM informing Pt that [employer] Screening form is ready for pick up at the front desk at his convenience. Copy of form sent for scanning.

## 2016-03-19 NOTE — Assessment & Plan Note (Signed)
Prediabetes-- check a A1c Hyperlipidemia- on statins, labs   RTC one year

## 2016-03-19 NOTE — Telephone Encounter (Signed)
Patient aware.

## 2016-04-22 ENCOUNTER — Other Ambulatory Visit: Payer: Self-pay | Admitting: Internal Medicine

## 2016-04-22 DIAGNOSIS — E785 Hyperlipidemia, unspecified: Secondary | ICD-10-CM

## 2016-06-04 ENCOUNTER — Other Ambulatory Visit: Payer: BLUE CROSS/BLUE SHIELD

## 2016-06-11 ENCOUNTER — Ambulatory Visit (INDEPENDENT_AMBULATORY_CARE_PROVIDER_SITE_OTHER): Payer: BLUE CROSS/BLUE SHIELD | Admitting: Internal Medicine

## 2016-06-11 ENCOUNTER — Encounter: Payer: Self-pay | Admitting: Internal Medicine

## 2016-06-11 VITALS — BP 124/78 | HR 65 | Temp 98.2°F | Resp 14 | Ht 70.25 in | Wt 171.0 lb

## 2016-06-11 DIAGNOSIS — E785 Hyperlipidemia, unspecified: Secondary | ICD-10-CM

## 2016-06-11 DIAGNOSIS — K649 Unspecified hemorrhoids: Secondary | ICD-10-CM

## 2016-06-11 DIAGNOSIS — K644 Residual hemorrhoidal skin tags: Secondary | ICD-10-CM

## 2016-06-11 MED ORDER — HYDROCORTISONE 2.5 % EX CREA: TOPICAL_CREAM | Freq: Two times a day (BID) | CUTANEOUS | 0 refills | Status: DC

## 2016-06-11 MED ORDER — LIDOCAINE 5 % EX OINT: 1.0000 "application " | TOPICAL_OINTMENT | CUTANEOUS | 0 refills | Status: DC | PRN

## 2016-06-11 NOTE — Progress Notes (Signed)
Subjective:    Patient ID: [name] [name], male    DOB: 10-May-1955, 61 y.o.   MRN: [id number]  DOS:  06/11/2016 Type of visit - description : acute Interval history: Felt a lump near the rectum 4 days ago, mild discomfort but no pain per se or bleeding. Not itching. Had similar episode few  years ago.    Review of Systems No abdominal pain, nausea, vomiting.  Past Medical History:  Diagnosis Date  . Allergic rhinitis   . Allergy   . Bronchitis   . History of kidney stones   . Hyperlipidemia   . 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 COLONOSCOPY  11/2005   Normal report/Gessner  . EYE SURGERY  2000   PRK bilateral  . HERNIA REPAIR  1995   RIH  . HERNIA REPAIR  03/21/11   Lap BIH w/mesh, Dr Abbey Chatters  . LITHOTRIPSY  2006  . TONSILLECTOMY AND ADENOIDECTOMY  1963  . VASECTOMY  1997    Social History   Social History  . Marital status: Married    Spouse name: N/A  . Number of children: 2  . Years of education: N/A   Occupational History  . Dossie Arbour retired, now works at a [employer] History Main Topics  . Smoking status: Never Smoker  . Smokeless tobacco: Never Used  . Alcohol use 0.6 oz/week    1 Glasses of wine per week     Comment: rare wine  . Drug use: No  . Sexual activity: Not on file   Other Topics Concern  . Not on file   Social History Narrative   2 children in college      Allergies as of 06/11/2016   No Known Allergies     Medication List       Accurate as of 06/11/16 11:59 PM. Always use your most recent med list.          aspirin 81 MG tablet Take 160 mg by mouth daily.   budesonide-formoterol 80-4.5 MCG/ACT inhaler Commonly known as:  SYMBICORT Inhale 2 puffs into the lungs 2 (two) times daily as needed.   cetirizine 10 MG tablet Commonly known as:  ZYRTEC Take 10 mg by mouth daily.   hydrocortisone 2.5 % cream Apply topically 2 (two)  times daily.   lidocaine 5 % ointment Commonly known as:  XYLOCAINE Apply 1 application topically as needed.   pravastatin 10 MG tablet Commonly known as:  PRAVACHOL Take 1 tablet (10 mg total) by mouth daily.   PROAIR HFA 108 (90 Base) MCG/ACT inhaler Generic drug:  albuterol Reported on 09/14/2015          Objective:   Physical Exam BP 124/78 (BP Location: Left Arm, Patient Position: Sitting, Cuff Size: Normal)   Pulse 65   Temp 98.2 F (36.8 C) (Oral)   Resp 14   Ht 5' 10.25" (1.784 m)   Wt 171 lb (77.6 kg)   SpO2 97%   BMI 24.36 kg/m  General:   Well developed, well nourished . NAD.  HEENT:  Normocephalic . Face symmetric, atraumatic  Abdomen:  Not distended, soft, non-tender. No rebound or rigidity.  Rectal exam: Has a 1.5 cm nontender hemorrhoid at the left side of the rectum. Skin is normal, no bleed, discharge. Rest of the distal rectal exam is unremarkable. No stools found. Neurologic:  alert &  oriented X3.  Speech normal, gait appropriate for age and unassisted Psych--  Cognition and judgment appear intact.  Cooperative with normal attention span and concentration.  Behavior appropriate. No anxious or depressed appearing.    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 Encouraged high fiber diet. Avoid constipation. See instructions. Call if sx severe. Hyperlipidemia: Has been doing great with diet, exercise, would like his cholesterol and blood sugar checked tomorrow. Will do. Form will be completed with results.

## 2016-06-11 NOTE — Patient Instructions (Signed)
GO TO THE FRONT DESK  Schedule labs to be done this week, fasting    Use the hydrocortisone and lidocaine as needed for pain and itching  Continue the healthy diet with fruits and vegetables. Okay to use Metamucil if needed  Avoid constipation, Colace as needed (stool softer).  If  this episode is not gradually improving let me know.  Call if severe symptoms   Hemorrhoids Hemorrhoids are swollen veins in and around the rectum or anus. There are two types of hemorrhoids:  Internal hemorrhoids. These occur in the veins that are just inside the rectum. They may poke through to the outside and become irritated and painful.  External hemorrhoids. These occur in the veins that are outside of the anus and can be felt as a painful swelling or hard lump near the anus. Most hemorrhoids do not cause serious problems, and they can be managed with home treatments such as diet and lifestyle changes. If home treatments do not help your symptoms, procedures can be done to shrink or remove the hemorrhoids. What are the causes? This condition is caused by increased pressure in the anal area. This pressure may result from various things, including:  Constipation.  Straining to have a bowel movement.  Diarrhea.  Pregnancy.  Obesity.  Sitting for long periods of time.  Heavy lifting or other activity that causes you to strain.  Anal sex. What are the signs or symptoms? Symptoms of this condition include:  Pain.  Anal itching or irritation.  Rectal bleeding.  Leakage of stool (feces).  Anal swelling.  One or more lumps around the anus. How is this diagnosed? This condition can often be diagnosed through a visual exam. Other exams or tests may also be done, such as:  Examination of the rectal area with a gloved hand (digital rectal exam).  Examination of the anal canal using a small tube (anoscope).  A blood test, if you have lost a significant amount of blood.  A test to  look inside the colon (sigmoidoscopy or colonoscopy). How is this treated? This condition can usually be treated at home. However, various procedures may be done if dietary changes, lifestyle changes, and other home treatments do not help your symptoms. These procedures can help make the hemorrhoids smaller or remove them completely. Some of these procedures involve surgery, and others do not. Common procedures include:  Rubber band ligation. Rubber bands are placed at the base of the hemorrhoids to cut off the blood supply to them.  Sclerotherapy. Medicine is injected into the hemorrhoids to shrink them.  Infrared coagulation. A type of light energy is used to get rid of the hemorrhoids.  Hemorrhoidectomy surgery. The hemorrhoids are surgically removed, and the veins that supply them are tied off.  Stapled hemorrhoidopexy surgery. A circular stapling device is used to remove the hemorrhoids and use staples to cut off the blood supply to them. Follow these instructions at home: Eating and drinking   Eat foods that have a lot of fiber in them, such as whole grains, beans, nuts, fruits, and vegetables. Ask your health care provider about taking products that have added fiber (fiber supplements).  Drink enough fluid to keep your urine clear or pale yellow. Managing pain and swelling   Take warm sitz baths for 20 minutes, 3-4 times a day to ease pain and discomfort.  If directed, apply ice to the affected area. Using ice packs between sitz baths may be helpful.  Put ice in a plastic bag.  Place a towel between your skin and the bag.  Leave the ice on for 20 minutes, 2-3 times a day. General instructions   Take over-the-counter and prescription medicines only as told by your health care provider.  Use medicated creams or suppositories as told.  Exercise regularly.  Go to the bathroom when you have the urge to have a bowel movement. Do not wait.  Avoid straining to have bowel  movements.  Keep the anal area dry and clean. Use wet toilet paper or moist towelettes after a bowel movement.  Do not sit on the toilet for long periods of time. This increases blood pooling and pain. Contact a health care provider if:  You have increasing pain and swelling that are not controlled by treatment or medicine.  You have uncontrolled bleeding.  You have difficulty having a bowel movement, or you are unable to have a bowel movement.  You have pain or inflammation outside the area of the hemorrhoids. This information is not intended to replace advice given to you by your health care provider. Make sure you discuss any questions you have with your health care provider. Document Released: 02/16/2000 Document Revised: 07/19/2015 Document Reviewed: 11/02/2014 Elsevier Interactive Patient Education  2017 [employer].

## 2016-06-11 NOTE — Progress Notes (Signed)
Pre visit review using our clinic review tool, if applicable. No additional management support is needed unless otherwise documented below in the visit note. 

## 2016-06-12 ENCOUNTER — Other Ambulatory Visit (INDEPENDENT_AMBULATORY_CARE_PROVIDER_SITE_OTHER): Payer: BLUE CROSS/BLUE SHIELD

## 2016-06-12 ENCOUNTER — Telehealth: Payer: Self-pay

## 2016-06-12 DIAGNOSIS — E785 Hyperlipidemia, unspecified: Secondary | ICD-10-CM | POA: Diagnosis not present

## 2016-06-12 LAB — LIPID PANEL
CHOLESTEROL: 211 mg/dL — AB (ref 0–200)
HDL: 57.5 mg/dL (ref >39.00)
LDL Cholesterol: 139 mg/dL — ABNORMAL HIGH (ref 0–99)
NonHDL: 153.61
Total CHOL/HDL Ratio: 4
Triglycerides: 72 mg/dL (ref 0.0–149.0)
VLDL: 14.4 mg/dL (ref 0.0–40.0)

## 2016-06-12 LAB — GLUCOSE, RANDOM: Glucose, Bld: 88 mg/dL (ref 70–99)

## 2016-06-12 NOTE — Assessment & Plan Note (Signed)
External hemorrhoid: Treat symptomatically with hydrocortisone, lidocaine as needed. Encouraged high fiber diet. Avoid constipation. See instructions. Call if sx severe. Hyperlipidemia: Has been doing great with diet, exercise, would like his cholesterol and blood sugar checked tomorrow. Will do. Form will be completed with results.

## 2016-06-12 NOTE — Telephone Encounter (Signed)
[employer] paperwork completed. Spoke w/ Pt, informed that form has been placed at front desk for pick up at his convenience. Copy of form sent for scanning.

## 2017-03-24 ENCOUNTER — Ambulatory Visit (INDEPENDENT_AMBULATORY_CARE_PROVIDER_SITE_OTHER): Payer: BLUE CROSS/BLUE SHIELD | Admitting: Internal Medicine

## 2017-03-24 ENCOUNTER — Encounter: Payer: Self-pay | Admitting: Internal Medicine

## 2017-03-24 VITALS — BP 118/74 | HR 64 | Temp 98.1°F | Resp 14 | Ht 70.0 in | Wt 171.2 lb

## 2017-03-24 DIAGNOSIS — Z0000 Encounter for general adult medical examination without abnormal findings: Secondary | ICD-10-CM | POA: Diagnosis not present

## 2017-03-24 DIAGNOSIS — R739 Hyperglycemia, unspecified: Secondary | ICD-10-CM

## 2017-03-24 LAB — CBC WITH DIFFERENTIAL/PLATELET
BASOS ABS: 0 10*3/uL (ref 0.0–0.1)
Basophils Relative: 0.5 % (ref 0.0–3.0)
EOS ABS: 0.9 10*3/uL — AB (ref 0.0–0.7)
Eosinophils Relative: 14.7 % — ABNORMAL HIGH (ref 0.0–5.0)
HEMATOCRIT: 43.5 % (ref 39.0–52.0)
Hemoglobin: 14.3 g/dL (ref 13.0–17.0)
LYMPHS PCT: 21.2 % (ref 12.0–46.0)
Lymphs Abs: 1.3 10*3/uL (ref 0.7–4.0)
MCHC: 32.8 g/dL (ref 30.0–36.0)
MCV: 92 fl (ref 78.0–100.0)
Monocytes Absolute: 0.4 10*3/uL (ref 0.1–1.0)
Monocytes Relative: 6 % (ref 3.0–12.0)
NEUTROS ABS: 3.6 10*3/uL (ref 1.4–7.7)
Neutrophils Relative %: 57.6 % (ref 43.0–77.0)
PLATELETS: 333 10*3/uL (ref 150.0–400.0)
RBC: 4.73 Mil/uL (ref 4.22–5.81)
RDW: 13.4 % (ref 11.5–15.5)
WBC: 6.3 10*3/uL (ref 4.0–10.5)

## 2017-03-24 LAB — LIPID PANEL
CHOL/HDL RATIO: 3
Cholesterol: 180 mg/dL (ref 0–200)
HDL: 56.9 mg/dL (ref >39.00)
LDL CALC: 110 mg/dL — AB (ref 0–99)
NONHDL: 122.87
TRIGLYCERIDES: 65 mg/dL (ref 0.0–149.0)
VLDL: 13 mg/dL (ref 0.0–40.0)

## 2017-03-24 LAB — COMPREHENSIVE METABOLIC PANEL
ALT: 18 U/L (ref 0–53)
AST: 25 U/L (ref 0–37)
Albumin: 4.2 g/dL (ref 3.5–5.2)
Alkaline Phosphatase: 44 U/L (ref 39–117)
BILIRUBIN TOTAL: 0.5 mg/dL (ref 0.2–1.2)
BUN: 15 mg/dL (ref 6–23)
CHLORIDE: 108 meq/L (ref 96–112)
CO2: 29 meq/L (ref 19–32)
Calcium: 9.5 mg/dL (ref 8.4–10.5)
Creatinine, Ser: 1.04 mg/dL (ref 0.40–1.50)
GFR: 77.05 mL/min (ref >60.00)
GLUCOSE: 99 mg/dL (ref 70–99)
Potassium: 5 mEq/L (ref 3.5–5.1)
SODIUM: 142 meq/L (ref 135–145)
Total Protein: 6.5 g/dL (ref 6.0–8.3)

## 2017-03-24 LAB — HEMOGLOBIN A1C: HEMOGLOBIN A1C: 5.7 % (ref 4.6–6.5)

## 2017-03-24 LAB — TSH: TSH: 1.39 u[IU]/mL (ref 0.35–4.50)

## 2017-03-24 NOTE — Assessment & Plan Note (Signed)
Here for CPX At risk of DM: Doing great with lifestyle.  Check an A1c Hyperlipidemia: Continue statins, labs Allergies, bronchospasm: on Zyrtec, very rarely use Symbicort. RTC 1 year

## 2017-03-24 NOTE — Assessment & Plan Note (Addendum)
-  Td 2017; Had a flu shot; shingrex discussed -CCS: Cscope normal 2007 per pt, Cscope 09-2015 ---tics, 10 years   -Prostate cancer screening: DRE  wnl 2017, will check a PSA today for trending. -Diet and exercise discussed, he is doing very well, goes to the gym 3 times a week. -Labs: FLP, CMP, CBC, A1c, PSA  RTC 1 year

## 2017-03-24 NOTE — Progress Notes (Signed)
Subjective:    Patient ID: [name] [name], male    DOB: [birth year], 62 y.o.   MRN: [id number]  DOS:  03/24/2017 Type of visit - description : cpx Interval history: No concerns    Review of Systems  A 14 point review of systems is negative    Past Medical History:  Diagnosis Date  . Allergic rhinitis   . Allergy   . History of kidney stones   . Hyperlipidemia   . 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 COLONOSCOPY  11/2005   Normal report/Gessner  . EYE SURGERY  2000   PRK bilateral  . HERNIA REPAIR  1995   RIH  . HERNIA REPAIR  03/21/11   Lap BIH w/mesh, Dr Abbey Chatters  . LITHOTRIPSY  2006  . TONSILLECTOMY AND ADENOIDECTOMY  1963  . VASECTOMY  1997    Social History   Socioeconomic History  . Marital status: Married    Spouse name: Not on file  . Number of children: 2  . Years of education: Not on file  . Highest education level: Not on file  Social Needs  . Financial resource strain: Not on file  . Food insecurity - worry: Not on file  . Food insecurity - inability: Not on file  . Transportation needs - medical: Not on file  . Transportation needs - non-medical: Not on file  Occupational History  . Occupation: Research scientist (life sciences), retired from [employer]  . Smoking status: Never Smoker  . Smokeless tobacco: Never Used  Substance and Sexual Activity  . Alcohol use: Yes    Alcohol/week: 0.6 oz    Types: 1 Glasses of wine per week    Comment: rare wine  . Drug use: No  . Sexual activity: Not on file  Other Topics Concern  . Not on file  Social History Narrative   1 children in college   1 child in [place]      Family History  Problem Relation Age of Onset  . Heart disease Mother        mitral valve prolapse  . Cancer Mother        uterine ; stage 1 melanoma of left shoulder   . Dementia Mother   . Cancer Father        liver and pancreatic(NOT colon)  .  Diabetes Father        due to pancreatic disease  . Pancreatic cancer Father   . Hyperlipidemia Brother   . Diabetes Brother   . Hypertension Brother   . Alzheimer's disease Maternal Aunt   . Cancer Paternal Uncle        stomach   . Stroke Neg Hx   . Colon cancer Neg Hx   . Prostate cancer Neg Hx   . Esophageal cancer Neg Hx   . Rectal cancer Neg Hx   . Stomach cancer Neg Hx      Allergies as of 03/24/2017   No Known Allergies     Medication List        Accurate as of 03/24/17  5:23 PM. Always use your most recent med list.          aspirin 81 MG tablet Take 160 mg by mouth daily.   budesonide-formoterol 80-4.5 MCG/ACT inhaler Commonly known as:  SYMBICORT Inhale 2 puffs into the lungs 2 (two) times daily as needed.  cetirizine 10 MG tablet Commonly known as:  ZYRTEC Take 10 mg by mouth daily.   hydrocortisone 2.5 % cream Apply topically 2 (two) times daily.   lidocaine 5 % ointment Commonly known as:  XYLOCAINE Apply 1 application topically as needed.   pravastatin 10 MG tablet Commonly known as:  PRAVACHOL Take 1 tablet (10 mg total) by mouth daily.   PROAIR HFA 108 (90 Base) MCG/ACT inhaler Generic drug:  albuterol Reported on 09/14/2015          Objective:   Physical Exam BP 118/74 (BP Location: Left Arm, Patient Position: Sitting, Cuff Size: Small)   Pulse 64   Temp 98.1 F (36.7 C) (Oral)   Resp 14   Ht 5\' 10"  (1.778 m)   Wt 171 lb 4 oz (77.7 kg)   SpO2 97%   BMI 24.57 kg/m  General:   Well developed, well nourished . NAD.  Neck: No  thyromegaly  HEENT:  Normocephalic . Face symmetric, atraumatic Lungs:  CTA B Normal respiratory effort, no intercostal retractions, no accessory muscle use. Heart: RRR,  no murmur.  No pretibial edema bilaterally  Abdomen:  Not distended, soft, non-tender. No rebound or rigidity.   Skin: Exposed areas without rash. Not pale. Not jaundice Neurologic:  alert & oriented X3.  Speech normal, gait  appropriate for age and unassisted Strength symmetric and appropriate for age.  Psych: Cognition and judgment appear intact.  Cooperative with normal attention span and concentration.  Behavior appropriate. 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  Check an A1c Hyperlipidemia: Continue statins, labs Allergies, bronchospasm: on Zyrtec, very rarely use Symbicort. RTC 1 year

## 2017-03-24 NOTE — Patient Instructions (Signed)
GO TO THE LAB : Get the blood work     GO TO THE FRONT DESK Schedule your next appointment for a  Physical in 1 year  

## 2017-03-24 NOTE — Progress Notes (Signed)
Pre visit review using our clinic review tool, if applicable. No additional management support is needed unless otherwise documented below in the visit note. 

## 2017-03-25 ENCOUNTER — Telehealth: Payer: Self-pay

## 2017-03-25 ENCOUNTER — Telehealth: Payer: Self-pay | Admitting: Internal Medicine

## 2017-03-25 NOTE — Telephone Encounter (Signed)
Spoke w/ Pt, informing him that physical form has been completed and placed at front desk for pick up at his convenience. Pt verbalized understanding. Copy of form sent for scanning.

## 2017-03-25 NOTE — Telephone Encounter (Signed)
Patient Letter was mailed out on 03/25/2017, to address on file.

## 2017-05-18 ENCOUNTER — Other Ambulatory Visit: Payer: Self-pay | Admitting: Internal Medicine

## 2017-05-18 DIAGNOSIS — E785 Hyperlipidemia, unspecified: Secondary | ICD-10-CM

## 2017-06-04 ENCOUNTER — Encounter: Payer: Self-pay | Admitting: Internal Medicine

## 2017-06-04 ENCOUNTER — Ambulatory Visit (INDEPENDENT_AMBULATORY_CARE_PROVIDER_SITE_OTHER): Payer: BLUE CROSS/BLUE SHIELD | Admitting: Internal Medicine

## 2017-06-04 VITALS — BP 124/68 | HR 67 | Temp 97.7°F | Resp 16 | Ht 70.0 in | Wt 168.1 lb

## 2017-06-04 DIAGNOSIS — J302 Other seasonal allergic rhinitis: Secondary | ICD-10-CM | POA: Diagnosis not present

## 2017-06-04 MED ORDER — PREDNISONE 10 MG PO TABS
ORAL_TABLET | ORAL | 0 refills | Status: DC
Start: 2017-06-04 — End: 2017-09-05

## 2017-06-04 MED ORDER — AZELASTINE HCL 0.1 % NA SOLN: 2.0000 | Freq: Two times a day (BID) | NASAL | 6 refills | Status: DC

## 2017-06-04 NOTE — Progress Notes (Signed)
Subjective:    Patient ID: [name] [name], male    DOB: [birth year], 62 y.o.   MRN: [id number]  DOS:  06/04/2017 Type of visit - description : acute Interval history: Sinus congestion for the last 2-3 weeks. Worse at night, has taken a variety of OTCs including nasal sprays, Flonase, Benadryl etc.  Also something similar to Afrin. Not feeling much better.   Review of Systems No fever chills No cough or chest congestion No sneezing No headache, sore throat or eye congestion.  Past Medical History:  Diagnosis Date  . Allergic rhinitis   . Allergy   . History of kidney stones   . Hyperlipidemia   . 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 COLONOSCOPY  11/2005   Normal report/[facility]ner  . EYE SURGERY  2000   PRK bilateral  . HERNIA REPAIR  1995   RIH  . HERNIA REPAIR  03/21/11   Lap BIH w/mesh, Dr Abbey Chatters  . LITHOTRIPSY  2006  . TONSILLECTOMY AND ADENOIDECTOMY  1963  . VASECTOMY  1997    Social History   Socioeconomic History  . Marital status: Married    Spouse name: Not on file  . Number of children: 2  . Years of education: Not on file  . Highest education level: Not on file  Occupational History  . Occupation: Research scientist (life sciences), retired from [employer]   Social Needs  . Financial resource strain: Not on file  . Food insecurity:    Worry: Not on file    Inability: Not on file  . Transportation needs:    Medical: Not on file    Non-medical: Not on file  Tobacco Use  . Smoking status: Never Smoker  . Smokeless tobacco: Never Used  Substance and Sexual Activity  . Alcohol use: Yes    Alcohol/week: 0.6 oz    Types: 1 Glasses of wine per week    Comment: rare wine  . Drug use: No  . Sexual activity: Not on file  Lifestyle  . Physical activity:    Days per week: Not on file    Minutes per session: Not on file  . Stress: Not on file  Relationships  . Social connections:    Talks  on phone: Not on file    Gets together: Not on file    Attends religious service: Not on file    Active member of club or organization: Not on file    Attends meetings of clubs or organizations: Not on file    Relationship status: Not on file  . Intimate partner violence:    Fear of current or ex partner: Not on file    Emotionally abused: Not on file    Physically abused: Not on file    Forced sexual activity: Not on file  Other Topics Concern  . Not on file  Social History Narrative   1 children in college   1 child in [place]       Allergies as of 06/04/2017   No Known Allergies     Medication List        Accurate as of 06/04/17 11:59 PM. Always use your most recent med list.          aspirin 81 MG tablet Take 160 mg by mouth daily.   azelastine 0.1 % nasal spray Commonly known as:  ASTELIN Place 2 sprays into both  nostrils 2 (two) times daily.   budesonide-formoterol 80-4.5 MCG/ACT inhaler Commonly known as:  SYMBICORT Inhale 2 puffs into the lungs 2 (two) times daily as needed.   cetirizine 10 MG tablet Commonly known as:  ZYRTEC Take 10 mg by mouth daily.   fluticasone 50 MCG/ACT nasal spray Commonly known as:  FLONASE Place 1-2 sprays into both nostrils daily.   lidocaine 5 % ointment Commonly known as:  XYLOCAINE Apply 1 application topically as needed.   pravastatin 10 MG tablet Commonly known as:  PRAVACHOL TAKE ONE TABLET BY MOUTH DAILY   predniSONE 10 MG tablet Commonly known as:  DELTASONE 2 tabs a day x 5 days   PROAIR HFA 108 (90 Base) MCG/ACT inhaler Generic drug:  albuterol Reported on 09/14/2015          Objective:   Physical Exam BP 124/68 (BP Location: Left Arm, Patient Position: Sitting, Cuff Size: Small)   Pulse 67   Temp 97.7 F (36.5 C) (Oral)   Resp 16   Ht 5\' 10"  (1.778 m)   Wt 168 lb 2 oz (76.3 kg)   SpO2 96%   BMI 24.12 kg/m  General:   Well developed, well nourished . NAD.  HEENT:  Normocephalic . Face  symmetric, atraumatic. TMs: TMs are slightly bulge, not red.  Throat symmetric, not red.  Nose congested.  Sinuses no TTP Lungs:  CTA B Normal respiratory effort, no intercostal retractions, no accessory muscle use. Heart: RRR,  no murmur.  No pretibial edema bilaterally  Skin: Not pale. Not jaundice Neurologic:  alert & oriented X3.  Speech normal, gait appropriate for age and unassisted Psych--  Cognition and judgment appear intact.  Cooperative with normal attention span and concentration.  Behavior appropriate. No anxious or depressed appearing.      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 Recommend to avoid Afrin.  Short-term treatment with prednisone.  Consistent use of Flonase and Astelin.  See AVS. To take one type of OTC antihistaminic daily, do not mix them. Call if not improving.

## 2017-06-04 NOTE — Patient Instructions (Addendum)
Prednisone x 5 days    For nasal congestion: Use OTC Nasocort or Flonase : 2 nasal sprays on each side of the nose in the morning until you feel better Use ASTELIN a prescribed spray : 2 nasal sprays on each side of the nose twice a day (ok to drop to night only at some point)   Avoid decongestants such as  AFRIN   claritin or Zyrtec or benadryl  Call if not gradually better over the next  10 days

## 2017-06-04 NOTE — Progress Notes (Signed)
Pre visit review using our clinic review tool, if applicable. No additional management support is needed unless otherwise documented below in the visit note. 

## 2017-06-05 NOTE — Assessment & Plan Note (Signed)
Allergies: Likely symptoms due to allergic sinusitis. Recommend to avoid Afrin.  Short-term treatment with prednisone.  Consistent use of Flonase and Astelin.  See AVS. To take one type of OTC antihistaminic daily, do not mix them. Call if not improving.

## 2017-09-05 ENCOUNTER — Encounter: Payer: Self-pay | Admitting: Internal Medicine

## 2017-09-05 ENCOUNTER — Ambulatory Visit (INDEPENDENT_AMBULATORY_CARE_PROVIDER_SITE_OTHER): Payer: BLUE CROSS/BLUE SHIELD | Admitting: Internal Medicine

## 2017-09-05 VITALS — BP 126/68 | HR 55 | Temp 98.0°F | Resp 16 | Ht 70.0 in | Wt 174.4 lb

## 2017-09-05 DIAGNOSIS — M545 Low back pain: Secondary | ICD-10-CM

## 2017-09-05 DIAGNOSIS — R399 Unspecified symptoms and signs involving the genitourinary system: Secondary | ICD-10-CM

## 2017-09-05 DIAGNOSIS — R112 Nausea with vomiting, unspecified: Secondary | ICD-10-CM

## 2017-09-05 MED ORDER — PREDNISONE 10 MG PO TABS
ORAL_TABLET | ORAL | 0 refills | Status: DC
Start: 2017-09-05 — End: 2018-03-23

## 2017-09-05 MED ORDER — CYCLOBENZAPRINE HCL 10 MG PO TABS: 10.0000 mg | ORAL_TABLET | Freq: Every evening | ORAL | 0 refills | Status: DC | PRN

## 2017-09-05 NOTE — Progress Notes (Signed)
Subjective:    Patient ID: [name] [name], male    DOB: [birth year], 62 y.o.   MRN: [id number]  DOS:  09/05/2017 Type of visit - description : acute Interval history:  Patient spent a week in [place],flew back home today. He was helping his son, did a lot of moving furniture and packing. Yesterday, he was lifting the lead of the water tank and suddenly developed mild to moderate pain at the back bilaterally. Since then, the pain has increased. Worse when he moves around, better when she is at rest. Occasionally it radiates down to the right thigh.  Review of Systems He also had a couple of other  symptoms: He had nausea and vomiting one time 5 days ago but no abdominal pain no blood in the stools.  Symptoms resolved A couple of night has some difficulty urinating, the last time he experienced that was 2 nights ago.  Denies dysuria, gross hematuria. No fever chills He also denies chest pain, difficulty breathing or calf swelling  Past Medical History:  Diagnosis Date  . Allergic rhinitis   . Allergy   . History of kidney stones   . Hyperlipidemia   . 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 COLONOSCOPY  11/2005   Normal report/[facility]  . EYE SURGERY  2000   PRK bilateral  . HERNIA REPAIR  1995   RIH  . HERNIA REPAIR  03/21/11   Lap BIH w/mesh, Dr Abbey Chatters  . LITHOTRIPSY  2006  . TONSILLECTOMY AND ADENOIDECTOMY  1963  . VASECTOMY  1997    Social History   Socioeconomic History  . Marital status: Married    Spouse name: Not on file  . Number of children: 2  . Years of education: Not on file  . Highest education level: Not on file  Occupational History  . Occupation: Research scientist (life sciences), retired from [employer]   Social Needs  . Financial resource strain: Not on file  . Food insecurity:    Worry: Not on file    Inability: Not on file  . Transportation needs:    Medical: Not on file   Non-medical: Not on file  Tobacco Use  . Smoking status: Never Smoker  . Smokeless tobacco: Never Used  Substance and Sexual Activity  . Alcohol use: Yes    Alcohol/week: 0.6 oz    Types: 1 Glasses of wine per week    Comment: rare wine  . Drug use: No  . Sexual activity: Not on file  Lifestyle  . Physical activity:    Days per week: Not on file    Minutes per session: Not on file  . Stress: Not on file  Relationships  . Social connections:    Talks on phone: Not on file    Gets together: Not on file    Attends religious service: Not on file    Active member of club or organization: Not on file    Attends meetings of clubs or organizations: Not on file    Relationship status: Not on file  . Intimate partner violence:    Fear of current or ex partner: Not on file    Emotionally abused: Not on file    Physically abused: Not on file    Forced sexual activity: Not on file  Other Topics Concern  . Not on file  Social History Narrative   1 children in  college   [place]       Allergies as of 09/05/2017   No Known Allergies     Medication List        Accurate as of 09/05/17  3:21 PM. Always use your most recent med list.          aspirin 81 MG tablet Take 160 mg by mouth daily.   azelastine 0.1 % nasal spray Commonly known as:  ASTELIN Place 2 sprays into both nostrils 2 (two) times daily.   budesonide-formoterol 80-4.5 MCG/ACT inhaler Commonly known as:  SYMBICORT Inhale 2 puffs into the lungs 2 (two) times daily as needed.   cetirizine 10 MG tablet Commonly known as:  ZYRTEC Take 10 mg by mouth daily.   fluticasone 50 MCG/ACT nasal spray Commonly known as:  FLONASE Place 1-2 sprays into both nostrils daily.   lidocaine 5 % ointment Commonly known as:  XYLOCAINE Apply 1 application topically as needed.   pravastatin 10 MG tablet Commonly known as:  PRAVACHOL TAKE ONE TABLET BY MOUTH DAILY   predniSONE 10 MG tablet Commonly known as:   DELTASONE 2 tabs a day x 5 days   PROAIR HFA 108 (90 Base) MCG/ACT inhaler Generic drug:  albuterol Reported on 09/14/2015          Objective:   Physical Exam BP 126/68 (BP Location: Left Arm, Patient Position: Sitting, Cuff Size: Small)   Pulse (!) 55   Temp 98 F (36.7 C) (Oral)   Resp 16   Ht 5\' 10"  (1.778 m)   Wt 174 lb 6 oz (79.1 kg)   SpO2 96%   BMI 25.02 kg/m  General:   Well developed, NAD, see BMI.  HEENT:  Normocephalic . Face symmetric, atraumatic MSK: No TTP at the lower back or SI areas. Skin: Not pale. Not jaundice Neurologic:  alert & oriented X3.  Speech normal, gait appropriate for age and unassisted Exline DTRs symmetric, motor symmetric, straight leg test negative. Significant antalgic posture and pain when he transfers from the chair to the table and when he laid down in the examining table. Psych--  Cognition and judgment appear intact.  Cooperative with normal attention span and concentration.  Behavior appropriate. No anxious or depressed appearing.     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 Rx  prednisone, Tylenol, warm compress and rest.  Gentle stretching discussed once he start feeling better.  Call if not improving. LUTS: Had difficulty urinating a couple of times, the last time was 2 nights ago, will check UA, urine culture to be thorough.  Doubt he has prostatitis or major infection. Nausea,vomiting x 1: resolved

## 2017-09-05 NOTE — Progress Notes (Signed)
Pre visit review using our clinic review tool, if applicable. No additional management support is needed unless otherwise documented below in the visit note. 

## 2017-09-05 NOTE — Patient Instructions (Signed)
GO TO THE LAB : Provide a urine sample  For back pain: Rest Heating pad Prednisone for few days, see prescription Tylenol  500 mg OTC 2 tabs a day every 8 hours as needed for pain No need to take ibuprofen, prednisone is an anti-inflammatory Gentle stretching once you start feeling better, see instructions Call if not gradually better

## 2017-09-06 LAB — URINE CULTURE
MICRO NUMBER:: 90799230
RESULT: NO GROWTH
SPECIMEN QUALITY: ADEQUATE

## 2017-09-06 LAB — URINALYSIS, ROUTINE W REFLEX MICROSCOPIC
Bilirubin Urine: NEGATIVE
Glucose, UA: NEGATIVE
HGB URINE DIPSTICK: NEGATIVE
Ketones, ur: NEGATIVE
LEUKOCYTES UA: NEGATIVE
NITRITE: NEGATIVE
PH: 6 (ref 5.0–8.0)
PROTEIN: NEGATIVE
Specific Gravity, Urine: 1.023 (ref 1.001–1.03)

## 2017-09-06 NOTE — Assessment & Plan Note (Signed)
Acute back pain: No red flag symptoms other than there pain being moderate to severe. Rx  prednisone, Tylenol, warm compress and rest.  Gentle stretching discussed once he start feeling better.  Call if not improving. LUTS: Had difficulty urinating a couple of times, the last time was 2 nights ago, will check UA, urine culture to be thorough.  Doubt he has prostatitis or major infection. Nausea,vomiting x 1: resolved

## 2017-09-11 ENCOUNTER — Encounter: Payer: Self-pay | Admitting: Internal Medicine

## 2018-03-09 ENCOUNTER — Other Ambulatory Visit: Payer: Self-pay | Admitting: Internal Medicine

## 2018-03-09 DIAGNOSIS — E785 Hyperlipidemia, unspecified: Secondary | ICD-10-CM

## 2018-03-23 ENCOUNTER — Ambulatory Visit (INDEPENDENT_AMBULATORY_CARE_PROVIDER_SITE_OTHER): Payer: BLUE CROSS/BLUE SHIELD | Admitting: Internal Medicine

## 2018-03-23 ENCOUNTER — Encounter: Payer: Self-pay | Admitting: Internal Medicine

## 2018-03-23 VITALS — BP 126/78 | HR 78 | Temp 98.1°F | Resp 16 | Ht 70.0 in | Wt 171.0 lb

## 2018-03-23 DIAGNOSIS — Z23 Encounter for immunization: Secondary | ICD-10-CM

## 2018-03-23 DIAGNOSIS — E785 Hyperlipidemia, unspecified: Secondary | ICD-10-CM | POA: Diagnosis not present

## 2018-03-23 DIAGNOSIS — Z0000 Encounter for general adult medical examination without abnormal findings: Secondary | ICD-10-CM | POA: Diagnosis not present

## 2018-03-23 DIAGNOSIS — Z125 Encounter for screening for malignant neoplasm of prostate: Secondary | ICD-10-CM

## 2018-03-23 LAB — CBC WITH DIFFERENTIAL/PLATELET
Basophils Absolute: 0 10*3/uL (ref 0.0–0.1)
Basophils Relative: 0.6 % (ref 0.0–3.0)
Eosinophils Absolute: 0.5 10*3/uL (ref 0.0–0.7)
Eosinophils Relative: 9.9 % — ABNORMAL HIGH (ref 0.0–5.0)
HCT: 42.2 % (ref 39.0–52.0)
Hemoglobin: 14.3 g/dL (ref 13.0–17.0)
LYMPHS ABS: 1.2 10*3/uL (ref 0.7–4.0)
Lymphocytes Relative: 21.2 % (ref 12.0–46.0)
MCHC: 33.9 g/dL (ref 30.0–36.0)
MCV: 91.4 fl (ref 78.0–100.0)
MONOS PCT: 5.1 % (ref 3.0–12.0)
Monocytes Absolute: 0.3 10*3/uL (ref 0.1–1.0)
Neutro Abs: 3.5 10*3/uL (ref 1.4–7.7)
Neutrophils Relative %: 63.2 % (ref 43.0–77.0)
Platelets: 311 10*3/uL (ref 150.0–400.0)
RBC: 4.62 Mil/uL (ref 4.22–5.81)
RDW: 12.9 % (ref 11.5–15.5)
WBC: 5.6 10*3/uL (ref 4.0–10.5)

## 2018-03-23 LAB — COMPREHENSIVE METABOLIC PANEL
ALT: 20 U/L (ref 0–53)
AST: 17 U/L (ref 0–37)
Albumin: 4.4 g/dL (ref 3.5–5.2)
Alkaline Phosphatase: 47 U/L (ref 39–117)
BUN: 20 mg/dL (ref 6–23)
CO2: 29 mEq/L (ref 19–32)
Calcium: 9.6 mg/dL (ref 8.4–10.5)
Chloride: 106 mEq/L (ref 96–112)
Creatinine, Ser: 1.12 mg/dL (ref 0.40–1.50)
GFR: 66.33 mL/min (ref >60.00)
Glucose, Bld: 89 mg/dL (ref 70–99)
Potassium: 4.6 mEq/L (ref 3.5–5.1)
Sodium: 141 mEq/L (ref 135–145)
Total Bilirubin: 0.5 mg/dL (ref 0.2–1.2)
Total Protein: 6.5 g/dL (ref 6.0–8.3)

## 2018-03-23 LAB — LIPID PANEL
CHOL/HDL RATIO: 3
Cholesterol: 203 mg/dL — ABNORMAL HIGH (ref 0–200)
HDL: 58.3 mg/dL (ref >39.00)
LDL Cholesterol: 130 mg/dL — ABNORMAL HIGH (ref 0–99)
NonHDL: 144.21
Triglycerides: 69 mg/dL (ref 0.0–149.0)
VLDL: 13.8 mg/dL (ref 0.0–40.0)

## 2018-03-23 LAB — PSA: PSA: 2.38 ng/mL (ref 0.10–4.00)

## 2018-03-23 LAB — TSH: TSH: 1.53 u[IU]/mL (ref 0.35–4.50)

## 2018-03-23 LAB — HEMOGLOBIN A1C: Hgb A1c MFr Bld: 5.8 % (ref 4.6–6.5)

## 2018-03-23 NOTE — Progress Notes (Signed)
Pre visit review using our clinic review tool, if applicable. No additional management support is needed unless otherwise documented below in the visit note. 

## 2018-03-23 NOTE — Assessment & Plan Note (Signed)
-  Td 2017; Had a flu shot 11/2017; Shingrex discussed, #1 provided today. 2 months -CCS: Cscope normal 2007 per pt, Cscope 09-2015 ---tics, 10 years   -Prostate cancer screening: DRE  wnl today, check a PSA -Diet and exercise discussed, he is doing great -Labs: FLP, CMP, CBC, TSH, A1c, PSA RTC 1 year

## 2018-03-23 NOTE — Patient Instructions (Signed)
GO TO THE LAB : Get the blood work     GO TO THE FRONT DESK Schedule appointment for your next physical also for your next Shingrix 2 months from today ("nurse visit")  Tylenol  500 mg OTC 2 tabs a day every 8 hours as needed for pain  IBUPROFEN (Advil or Motrin) sporadically for pain Always take it with food because may cause gastritis and ulcers.  If you notice nausea, stomach pain, change in the color of stools --->  Stop the medicine and let us know

## 2018-03-23 NOTE — Progress Notes (Signed)
Subjective:    Patient ID: [name] [name], male    DOB: 1955/05/05, 63 y.o.   MRN: [id number]  DOS:  03/23/2018 Type of visit - description: CPX Has few concerns, see next  Review of Systems He remains very active, goes to the gym, do some cardio and lifting. Has noted occasional soreness at the knees when he squats and at the elbows when he do some lifting. No fever chills, no headaches or weight loss No myalgias per se.  No pain or swelling in the wrist or hands.  Other than above, a 14 point review of systems is negative    Past Medical History:  Diagnosis Date  . Allergic rhinitis   . Allergy   . History of kidney stones   . Hyperlipidemia   . 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 EYE SURGERY  2000   PRK bilateral  . HERNIA REPAIR  1995   RIH  . HERNIA REPAIR  03/21/11   Lap BIH w/mesh, Dr Abbey Chatters  . LITHOTRIPSY  2006  . TONSILLECTOMY AND ADENOIDECTOMY  1963  . VASECTOMY  1997    Social History   Socioeconomic History  . Marital status: Married    Spouse name: Not on file  . Number of children: 2  . Years of education: Not on file  . Highest education level: Not on file  Occupational History  . Occupation: Research scientist (life sciences)   . Occupation: retired from [employer]  . Financial resource strain: Not on file  . Food insecurity:    Worry: Not on file    Inability: Not on file  . Transportation needs:    Medical: Not on file    Non-medical: Not on file  Tobacco Use  . Smoking status: Never Smoker  . Smokeless tobacco: Never Used  Substance and Sexual Activity  . Alcohol use: Yes    Alcohol/week: 1.0 standard drinks    Types: 1 Glasses of wine per week    Comment: rare wine  . Drug use: No  . Sexual activity: Not on file  Lifestyle  . Physical activity:    Days per week: Not on file    Minutes per session: Not on file  . Stress: Not on file  Relationships    . Social connections:    Talks on phone: Not on file    Gets together: Not on file    Attends religious service: Not on file    Active member of club or organization: Not on file    Attends meetings of clubs or organizations: Not on file    Relationship status: Not on file  . Intimate partner violence:    Fear of current or ex partner: Not on file    Emotionally abused: Not on file    Physically abused: Not on file    Forced sexual activity: Not on file  Other Topics Concern  . Not on file  Social History Narrative   1 children in college   1 child in [place]      Family History  Problem Relation Age of Onset  . Heart disease Mother        mitral valve prolapse  . Cancer Mother        uterine ; stage 1 melanoma of left shoulder   . Dementia Mother   . Cancer Father  liver and pancreatic(NOT colon)  . Diabetes Father        due to pancreatic disease  . Pancreatic cancer Father   . Hyperlipidemia Brother   . Diabetes Brother   . Hypertension Brother   . Alzheimer's disease Maternal Aunt   . Cancer Paternal Uncle        stomach   . Stroke Neg Hx   . Colon cancer Neg Hx   . Prostate cancer Neg Hx   . Esophageal cancer Neg Hx   . Rectal cancer Neg Hx   . Stomach cancer Neg Hx      Allergies as of 03/23/2018   No Known Allergies     Medication List       Accurate as of March 23, 2018 11:59 PM. Always use your most recent med list.        aspirin 81 MG tablet Take 160 mg by mouth daily.   budesonide-formoterol 80-4.5 MCG/ACT inhaler Commonly known as:  SYMBICORT Inhale 2 puffs into the lungs 2 (two) times daily as needed.   cetirizine 10 MG tablet Commonly known as:  ZYRTEC Take 10 mg by mouth daily.   pravastatin 10 MG tablet Commonly known as:  PRAVACHOL Take 1 tablet (10 mg total) by mouth daily.   PROAIR HFA 108 (90 Base) MCG/ACT inhaler Generic drug:  albuterol Reported on 09/14/2015           Objective:   Physical Exam BP 126/78  (BP Location: Left Arm, Patient Position: Sitting, Cuff Size: Normal)   Pulse 78   Temp 98.1 F (36.7 C) (Oral)   Resp 16   Ht 5\' 10"  (1.778 m)   Wt 171 lb (77.6 kg)   SpO2 98%   BMI 24.54 kg/m  General: Well developed, NAD, BMI noted Neck: No  thyromegaly  HEENT:  Normocephalic . Face symmetric, atraumatic Lungs:  CTA B Normal respiratory effort, no intercostal retractions, no accessory muscle use. Heart: RRR,  no murmur.  No pretibial edema bilaterally  Abdomen:  Not distended, soft, non-tender. No rebound or rigidity.   Skin: Exposed areas without rash. Not pale. Not jaundice Rectal: External abnormalities: none. Normal sphincter tone. No rectal masses or tenderness.  No  stools Prostate: Prostate gland firm and smooth, no enlargement, nodularity, tenderness, mass, asymmetry or induration Neurologic:  alert & oriented X3.  Speech normal, gait appropriate for age and unassisted Strength symmetric and appropriate for age.  Psych: Cognition and judgment appear intact.  Cooperative with normal attention span and concentration.  Behavior appropriate. No anxious or depressed appearing.     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 Hyperlipidemia, on Pravachol, checking labs History of bronchospasm: Uses prescriptions as needed Nephrolithiasis: Currently asymptomatic but reports he did pass a small stone few months ago. Arthralgias: As described above, no evidence of an autoimmune disease, most likely symptoms are due to simply being active, recommend not to stop, judicious use of Tylenol and ibuprofen. RTC 1 year

## 2018-03-24 ENCOUNTER — Telehealth: Payer: Self-pay

## 2018-03-24 DIAGNOSIS — Z0279 Encounter for issue of other medical certificate: Secondary | ICD-10-CM

## 2018-03-24 MED ORDER — PRAVASTATIN SODIUM 10 MG PO TABS: 10.0000 mg | ORAL_TABLET | Freq: Every day | ORAL | 3 refills | Status: DC

## 2018-03-24 NOTE — Telephone Encounter (Signed)
Spoke w/ Pt- informed that form has been completed and original placed at front desk for pick-up. Copy sent for scanning.

## 2018-03-24 NOTE — Assessment & Plan Note (Signed)
At risk for DM: Check A1c, he has an excellent lifestyle. Hyperlipidemia, on Pravachol, checking labs History of bronchospasm: Uses prescriptions as needed Nephrolithiasis: Currently asymptomatic but reports he did pass a small stone few months ago. Arthralgias: As described above, no evidence of an autoimmune disease, most likely symptoms are due to simply being active, recommend not to stop, judicious use of Tylenol and ibuprofen. RTC 1 year

## 2018-03-24 NOTE — Addendum Note (Signed)
Addended by[place]rad [place]: Doria Fern D on: 03/24/2018 05:10 PM   Modules accepted: Orders

## 2018-04-14 DIAGNOSIS — M7712 Lateral epicondylitis, left elbow: Secondary | ICD-10-CM | POA: Insufficient documentation

## 2018-05-21 ENCOUNTER — Ambulatory Visit (INDEPENDENT_AMBULATORY_CARE_PROVIDER_SITE_OTHER): Payer: BLUE CROSS/BLUE SHIELD

## 2018-05-21 ENCOUNTER — Other Ambulatory Visit: Payer: Self-pay

## 2018-05-21 DIAGNOSIS — Z23 Encounter for immunization: Secondary | ICD-10-CM

## 2018-05-21 NOTE — Progress Notes (Signed)
Pt here today for Shingrix #2. 0.77mL injected into R deltoid. Pt tolerated injection well.

## 2018-07-13 ENCOUNTER — Ambulatory Visit (INDEPENDENT_AMBULATORY_CARE_PROVIDER_SITE_OTHER): Payer: BLUE CROSS/BLUE SHIELD | Admitting: Internal Medicine

## 2018-07-13 ENCOUNTER — Encounter: Payer: Self-pay | Admitting: Internal Medicine

## 2018-07-13 ENCOUNTER — Other Ambulatory Visit: Payer: Self-pay

## 2018-07-13 DIAGNOSIS — M545 Low back pain, unspecified: Secondary | ICD-10-CM

## 2018-07-13 MED ORDER — PREDNISONE 10 MG PO TABS: ORAL_TABLET | ORAL | 0 refills | Status: DC

## 2018-07-13 NOTE — Progress Notes (Signed)
Subjective:    Patient ID: [name] [name], male    DOB: 11/23/1955, 63 y.o.   MRN: [id number]  DOS:  07/13/2018 Type of visit - description: Virtual Visit via Video Note  I connected with@ on 07/13/18 at  1:20 PM EDT by a video enabled telemedicine application and verified that I am speaking with the correct person using two identifiers.   THIS ENCOUNTER IS A VIRTUAL VISIT DUE TO COVID-19 - PATIENT WAS NOT SEEN IN THE OFFICE. PATIENT HAS CONSENTED TO VIRTUAL VISIT / TELEMEDICINE VISIT   Location of patient: home  Location of provider: office  I discussed the limitations of evaluation and management by telemedicine and the availability of in person appointments. The patient expressed understanding and agreed to proceed.  History of Present Illness: Acute visit This morning, he was at work and lifted 20 to 25 pound box, he felt a mild back discomfort. Then, he had to bend over again and he developed a sharp low back pain, center of the back.  No radiation.  Pain is worse when he stands up and better when he sits down. Similar symptoms before, responded very well to prednisone.  Otherwise, he is doing well, eating healthy, exercising regularly on his bike.  He is not doing much stretching or weightlifting at the gym as he used to since the quarantine for COVID-19 started.  Review of Systems No fever chills No rash No cough  Past Medical History:  Diagnosis Date  . Allergic rhinitis   . Allergy   . History of kidney stones   . Hyperlipidemia   . 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 EYE SURGERY  2000   PRK bilateral  . HERNIA REPAIR  1995   RIH  . HERNIA REPAIR  03/21/11   Lap BIH w/mesh, Dr Abbey Chattersosenbower  . LITHOTRIPSY  2006  . TONSILLECTOMY AND ADENOIDECTOMY  1963  . VASECTOMY  1997    Social History   Socioeconomic History  . Marital status: Married    Spouse name: Not on file  . Number  of children: 2  . Years of education: Not on file  . Highest education level: Not on file  Occupational History  . Occupation: Research scientist (life sciences)[employer]   . Occupation: retired from [employer]  Social Needs  . Financial resource strain: Not on file  . Food insecurity:    Worry: Not on file    Inability: Not on file  . Transportation needs:    Medical: Not on file    Non-medical: Not on file  Tobacco Use  . Smoking status: Never Smoker  . Smokeless tobacco: Never Used  Substance and Sexual Activity  . Alcohol use: Yes    Alcohol/week: 1.0 standard drinks    Types: 1 Glasses of wine per week    Comment: rare wine  . Drug use: No  . Sexual activity: Not on file  Lifestyle  . Physical activity:    Days per week: Not on file    Minutes per session: Not on file  . Stress: Not on file  Relationships  . Social connections:    Talks on phone: Not on file    Gets together: Not on file    Attends religious service: Not on file    Active member of club or organization: Not on file    Attends meetings of clubs or organizations: Not on  file    Relationship status: Not on file  . Intimate partner violence:    Fear of current or ex partner: Not on file    Emotionally abused: Not on file    Physically abused: Not on file    Forced sexual activity: Not on file  Other Topics Concern  . Not on file  Social History Narrative   1 children in college   1 child in [place]       Allergies as of 07/13/2018   No Known Allergies     Medication List       Accurate as of Jul 13, 2018  1:18 PM. If you have any questions, ask your nurse or doctor.        aspirin 81 MG tablet Take 160 mg by mouth daily.   budesonide-formoterol 80-4.5 MCG/ACT inhaler Commonly known as:  SYMBICORT Inhale 2 puffs into the lungs 2 (two) times daily as needed.   cetirizine 10 MG tablet Commonly known as:  ZYRTEC Take 10 mg by mouth daily.   pravastatin 10 MG tablet Commonly known as:  PRAVACHOL Take 1 tablet  (10 mg total) by mouth daily.   ProAir HFA 108 (90 Base) MCG/ACT inhaler Generic drug:  albuterol Reported on 09/14/2015           Objective:   Physical Exam There were no vitals taken for this visit. This is a virtual video visit, patient is alert oriented x3 no apparent distress.    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 Plan: Prednisone, OTC Tylenol, use either heat/cold what ever works better for him for few days.  Call if not gradually improving. Long-term treatment would be core muscle strengthening, offered PT, declined need for now.   I discussed the assessment and treatment plan with the patient. The patient was provided an opportunity to ask questions and all were answered. The patient agreed with the plan and demonstrated an understanding of the instructions.   The patient was advised to call back or seek an in-person evaluation if the symptoms worsen or if the condition fails to improve as anticipated.

## 2018-07-14 NOTE — Assessment & Plan Note (Signed)
Lumbalgia: Acute, no radiculopathy on clinical grounds, previously prednisone helped well. Plan: Prednisone, OTC Tylenol, use either heat/cold what ever works better for him for few days.  Call if not gradually improving. Long-term treatment would be core muscle strengthening, offered PT, declined need for now.

## 2018-11-13 ENCOUNTER — Encounter: Payer: Self-pay | Admitting: Internal Medicine

## 2019-03-19 ENCOUNTER — Other Ambulatory Visit: Payer: Self-pay

## 2019-03-22 ENCOUNTER — Encounter: Payer: Self-pay | Admitting: Internal Medicine

## 2019-03-22 ENCOUNTER — Ambulatory Visit (INDEPENDENT_AMBULATORY_CARE_PROVIDER_SITE_OTHER): Payer: BC Managed Care – PPO | Admitting: Internal Medicine

## 2019-03-22 ENCOUNTER — Other Ambulatory Visit: Payer: Self-pay

## 2019-03-22 VITALS — BP 122/74 | HR 63 | Temp 97.0°F | Resp 16 | Ht 70.0 in | Wt 170.2 lb

## 2019-03-22 DIAGNOSIS — R739 Hyperglycemia, unspecified: Secondary | ICD-10-CM

## 2019-03-22 DIAGNOSIS — E785 Hyperlipidemia, unspecified: Secondary | ICD-10-CM

## 2019-03-22 DIAGNOSIS — Z0000 Encounter for general adult medical examination without abnormal findings: Secondary | ICD-10-CM

## 2019-03-22 LAB — COMPREHENSIVE METABOLIC PANEL
ALT: 76 U/L — ABNORMAL HIGH (ref 0–53)
AST: 25 U/L (ref 0–37)
Albumin: 4.3 g/dL (ref 3.5–5.2)
Alkaline Phosphatase: 70 U/L (ref 39–117)
BUN: 15 mg/dL (ref 6–23)
CO2: 27 mEq/L (ref 19–32)
Calcium: 9.4 mg/dL (ref 8.4–10.5)
Chloride: 107 mEq/L (ref 96–112)
Creatinine, Ser: 0.9 mg/dL (ref 0.40–1.50)
GFR: 85.1 mL/min (ref >60.00)
Glucose, Bld: 91 mg/dL (ref 70–99)
Potassium: 4.7 mEq/L (ref 3.5–5.1)
Sodium: 141 mEq/L (ref 135–145)
Total Bilirubin: 0.5 mg/dL (ref 0.2–1.2)
Total Protein: 6.4 g/dL (ref 6.0–8.3)

## 2019-03-22 LAB — CBC WITH DIFFERENTIAL/PLATELET
Basophils Absolute: 0.1 10*3/uL (ref 0.0–0.1)
Basophils Relative: 0.9 % (ref 0.0–3.0)
Eosinophils Absolute: 0.3 10*3/uL (ref 0.0–0.7)
Eosinophils Relative: 5.6 % — ABNORMAL HIGH (ref 0.0–5.0)
HCT: 41.6 % (ref 39.0–52.0)
Hemoglobin: 13.5 g/dL (ref 13.0–17.0)
Lymphocytes Relative: 23.7 % (ref 12.0–46.0)
Lymphs Abs: 1.4 10*3/uL (ref 0.7–4.0)
MCHC: 32.4 g/dL (ref 30.0–36.0)
MCV: 93 fl (ref 78.0–100.0)
Monocytes Absolute: 0.3 10*3/uL (ref 0.1–1.0)
Monocytes Relative: 5.6 % (ref 3.0–12.0)
Neutro Abs: 3.9 10*3/uL (ref 1.4–7.7)
Neutrophils Relative %: 64.2 % (ref 43.0–77.0)
Platelets: 360 10*3/uL (ref 150.0–400.0)
RBC: 4.47 Mil/uL (ref 4.22–5.81)
RDW: 13.2 % (ref 11.5–15.5)
WBC: 6 10*3/uL (ref 4.0–10.5)

## 2019-03-22 LAB — LIPID PANEL
Cholesterol: 222 mg/dL — ABNORMAL HIGH (ref 0–200)
HDL: 61.3 mg/dL (ref >39.00)
LDL Cholesterol: 149 mg/dL — ABNORMAL HIGH (ref 0–99)
NonHDL: 160.92
Total CHOL/HDL Ratio: 4
Triglycerides: 60 mg/dL (ref 0.0–149.0)
VLDL: 12 mg/dL (ref 0.0–40.0)

## 2019-03-22 LAB — HEMOGLOBIN A1C: Hgb A1c MFr Bld: 5.6 % (ref 4.6–6.5)

## 2019-03-22 NOTE — Assessment & Plan Note (Signed)
Here for CPX At risk of DM: Check A1c Hyperlipidemia: On a low-dose of Pravachol for a while, 10-year cardiovascular risk is 9.3%, checking labs, consider adjust up the dose of Pravachol.  Since he is already taking medication I would like to see his cardiovascular risk even lower. Serous otitis: Despite lack of allergy symptoms, he feels like water in his ears, TMs are slightly bulged, in addition to Zyrtec recommend Flonase.  Call if not better.  Mild tinnitus could be from serous otitis versus use of NSAIDs (see next). Radiculopathy: Has been taking NSAIDs with only  temporary relief, we agreed he will follow-up with orthopedic surgery. RTC 1 year

## 2019-03-22 NOTE — Assessment & Plan Note (Signed)
-  Td 2017 - Had a flu shot   - s/p  Shingrex   -CCS: Cscope normal 2007 per pt, Cscope 09-2015 ---tics, 10 years   -Prostate cancer screening: DRE-PSA wnl 2020 -Diet and exercise discussed: -Labs:  CMP, FLP, CBC, A1c RTC 1 year

## 2019-03-22 NOTE — Progress Notes (Signed)
Subjective:    Patient ID: [name] [name], male    DOB: 1955/11/07, 64 y.o.   MRN: [id number]  DOS:  03/22/2019 Type of visit - description: CPX In general feeling well. Did develop a pain at the posterior thigh, right side, saw orthopedic surgery, they believe sxs are d/t  radiculopathy. Has taken NSAIDs with temporary help.  Also has feeling of fluid on his ears and some tinnitus.   Review of Systems  Other than above, a 14 point review of systems is negative     Past Medical History:  Diagnosis Date  . Allergic rhinitis   . Allergy   . History of kidney stones   . Hyperlipidemia   . 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 EYE SURGERY  2000   Evaro bilateral  . [place]  . HERNIA REPAIR  03/21/11   Lap BIH w/mesh, Dr Zella Richer  . LITHOTRIPSY  2006  . TONSILLECTOMY AND ADENOIDECTOMY  1963  . VASECTOMY  1997    Family History  Problem Relation Age of Onset  . Heart disease Mother        mitral valve prolapse  . Cancer Mother        uterine ; stage 1 melanoma of left shoulder   . Dementia Mother   . Cancer Father        liver and pancreatic(NOT colon)  . Diabetes Father        due to pancreatic disease  . Pancreatic cancer Father   . Hyperlipidemia Brother   . Diabetes Brother   . Hypertension Brother   . Alzheimer's disease Maternal Aunt   . Cancer Paternal Uncle        stomach   . Stroke Neg Hx   . Colon cancer Neg Hx   . Prostate cancer Neg Hx   . Esophageal cancer Neg Hx   . Rectal cancer Neg Hx   . Stomach cancer Neg Hx          Objective:   Physical Exam BP 122/74 (BP Location: Left Arm, Patient Position: Sitting, Cuff Size: Small)   Pulse 63   Temp (!) 97 F (36.1 C) (Temporal)   Resp 16   Ht 5\' 10"  (1.778 m)   Wt 170 lb 4 oz (77.2 kg)   SpO2 99%   BMI 24.43 kg/m  General: Well developed, NAD, BMI noted Neck: No  thyromegaly  HEENT:   Normocephalic . Face symmetric, atraumatic.  TMs are both slightly bulged but not red. Lungs:  CTA B Normal respiratory effort, no intercostal retractions, no accessory muscle use. Heart: RRR,  no murmur.  No pretibial edema bilaterally  Abdomen:  Not distended, soft, non-tender. No rebound or rigidity.   Skin: Exposed areas without rash. Not pale. Not jaundice Neurologic:  alert & oriented X3.  Speech normal, gait appropriate for age and unassisted Strength symmetric and appropriate for age.  Psych: Cognition and judgment appear intact.  Cooperative with normal attention span and concentration.  Behavior appropriate. No anxious or depressed appearing.     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  Since he is already taking medication I would like to see his cardiovascular risk even lower. Serous otitis: Despite lack of allergy symptoms, he feels like water in his ears, TMs are slightly bulged, in addition to Zyrtec recommend Flonase.  Call if not better.  Mild tinnitus could be from serous otitis versus use of NSAIDs (see next). Radiculopathy: Has been taking NSAIDs with only  temporary relief, we agreed he will follow-up with orthopedic surgery. RTC 1 year    This visit occurred during the SARS-CoV-2 public health emergency.  Safety protocols were in place, including screening questions prior to the visit, additional usage of staff PPE, and extensive cleaning of exam room while observing appropriate contact time as indicated for disinfecting solutions.

## 2019-03-22 NOTE — Patient Instructions (Signed)
GO TO THE LAB : Get the blood work     GO TO THE FRONT DESK Schedule your next appointment for a physical exam in 1 year    We are committed to keeping you informed about the COVID-19 vaccine.  As the vaccine continues to become available for each phase, we will ensure that patients who meet the criteria receive the information they need to access vaccination opportunities. Continue to check your MyChart account and [email or web address] for updates. Please review the Phase 1b information below.  Following [place]'s guidelines for the distribution of COVID-19 vaccines we are pleased to share our plans to begin offering vaccines to those 65 and older (Phase 1b). Here are details of those plans:  [facility] COVID-19 Vaccination Clinic - [place] On Tuesday, Jan. 19, the [facility] Division of Public Health [facility]) and [place] begin large-scale COVID-19 vaccinations at the [facility]. The vaccinations are appointment only and for those 65 and older.  Walk-ins will not be accepted.  All appointments are currently filled. Please join our waiting list for the next available appointments. We will contact you when appointments become available. Please do not sign up more than once.  Join Our Waiting List   Other Vaccination Opportunities in Our Area We are also working in partnership with county health agencies in our service counties to ensure continuing vaccination availability in the weeks and months ahead. Learn more about each county's vaccination efforts in the website links below:   [place]  [place]  [place]  [facility] 's phase 1b vaccination guidelines, prioritizing those 65 and over as the next eligible group to receive the COVID-19 vaccine, are detailed at https://www.carlson.net/.   Vaccine Safety and Effectiveness Clinical trials for the Pfizer COVID-19 vaccine involved 42,000 people  and showed that the vaccine is more than 95% effective in preventing COVID-19 with no serious safety concerns. Similar results have been reported for the Moderna COVID-19 vaccine. Side effects reported in the Pfizer clinical trials include a sore arm at the injection site, fatigue, headache, chills and fever. While side effects from the Pfizer COVID-19 vaccine are higher than for a typical flu vaccine, they are lower in many ways than side effects from the leading vaccine to prevent shingles. Side effects are signs that a vaccine is working and are related to your immune system being stimulated to produce antibodies against infection. Side effects from vaccination are far less significant than health impacts from COVID-19.  Staying Informed Pharmacists, infectious disease doctors, critical care nurses and other experts at [facility] continue to speak publicly through media interviews and direct communication with our patients and communities about the safety, effectiveness and importance of vaccines to eliminate COVID-19. In addition, reliable information on vaccine safety, effectiveness, side effects and more is available on the following websites:  [place] Department of Health and Human Services COVID-19 Vaccine Information Website.  U.S. Centers for Disease Control and Prevention COVID-19 Librarian, academic.  Staying Safe We agree with the CDC on what we can do to help our communities get back to normal: Getting "back to normal" is going to take all of our tools. If we use all the tools we have, we stand the best chance of getting our families, communities, schools and workplaces "back to normal" sooner:  Get vaccinated as soon as vaccines become available within the phase of the state's vaccination rollout plan for which you meet the eligibility criteria.  Wear  a mask.  Stay 6 feet from others and avoid crowds.  Wash hands often.  For our most current information, please visit  [email or web address].

## 2019-03-22 NOTE — Progress Notes (Signed)
Pre visit review using our clinic review tool, if applicable. No additional management support is needed unless otherwise documented below in the visit note. 

## 2019-03-24 NOTE — Addendum Note (Signed)
Addended byConrad [place] D on: 03/24/2019 04:59 PM   Modules accepted: Orders

## 2019-03-25 ENCOUNTER — Telehealth: Payer: Self-pay

## 2019-03-25 NOTE — Telephone Encounter (Signed)
Spoke w/ Pt- informed that physical form is ready for pick up at his convenience. Copy of form sent for scanning.

## 2019-03-28 ENCOUNTER — Other Ambulatory Visit: Payer: Self-pay | Admitting: Internal Medicine

## 2019-03-28 DIAGNOSIS — E785 Hyperlipidemia, unspecified: Secondary | ICD-10-CM

## 2019-04-21 ENCOUNTER — Encounter (HOSPITAL_BASED_OUTPATIENT_CLINIC_OR_DEPARTMENT_OTHER): Payer: Self-pay | Admitting: *Deleted

## 2019-04-21 ENCOUNTER — Emergency Department (HOSPITAL_BASED_OUTPATIENT_CLINIC_OR_DEPARTMENT_OTHER): Payer: BC Managed Care – PPO

## 2019-04-21 ENCOUNTER — Emergency Department (HOSPITAL_BASED_OUTPATIENT_CLINIC_OR_DEPARTMENT_OTHER)
Admission: EM | Admit: 2019-04-21 | Discharge: 2019-04-21 | Disposition: A | Discharge status: Home / Self Care | Payer: BC Managed Care – PPO | Attending: Emergency Medicine | Admitting: Emergency Medicine

## 2019-04-21 ENCOUNTER — Other Ambulatory Visit: Payer: Self-pay

## 2019-04-21 DIAGNOSIS — N201 Calculus of ureter: Secondary | ICD-10-CM | POA: Insufficient documentation

## 2019-04-21 DIAGNOSIS — Z79899 Other long term (current) drug therapy: Secondary | ICD-10-CM | POA: Diagnosis not present

## 2019-04-21 DIAGNOSIS — Z7982 Long term (current) use of aspirin: Secondary | ICD-10-CM | POA: Diagnosis not present

## 2019-04-21 DIAGNOSIS — R109 Unspecified abdominal pain: Secondary | ICD-10-CM | POA: Diagnosis present

## 2019-04-21 LAB — URINALYSIS, MICROSCOPIC (REFLEX)

## 2019-04-21 LAB — CBC WITH DIFFERENTIAL/PLATELET
Abs Immature Granulocytes: 0.02 10*3/uL (ref 0.00–0.07)
Basophils Absolute: 0.1 10*3/uL (ref 0.0–0.1)
Basophils Relative: 1 %
Eosinophils Absolute: 0.6 10*3/uL — ABNORMAL HIGH (ref 0.0–0.5)
Eosinophils Relative: 8 %
HCT: 42.1 % (ref 39.0–52.0)
Hemoglobin: 13.6 g/dL (ref 13.0–17.0)
Immature Granulocytes: 0 %
Lymphocytes Relative: 24 %
Lymphs Abs: 1.9 10*3/uL (ref 0.7–4.0)
MCH: 30.6 pg (ref 26.0–34.0)
MCHC: 32.3 g/dL (ref 30.0–36.0)
MCV: 94.6 fL (ref 80.0–100.0)
Monocytes Absolute: 0.5 10*3/uL (ref 0.1–1.0)
Monocytes Relative: 7 %
Neutro Abs: 4.6 10*3/uL (ref 1.7–7.7)
Neutrophils Relative %: 60 %
Platelets: 328 10*3/uL (ref 150–400)
RBC: 4.45 MIL/uL (ref 4.22–5.81)
RDW: 12.3 % (ref 11.5–15.5)
WBC: 7.7 10*3/uL (ref 4.0–10.5)
nRBC: 0 % (ref 0.0–0.2)

## 2019-04-21 LAB — BASIC METABOLIC PANEL
Anion gap: 11 (ref 5–15)
BUN: 18 mg/dL (ref 8–23)
CO2: 23 mmol/L (ref 22–32)
Calcium: 9.5 mg/dL (ref 8.9–10.3)
Chloride: 108 mmol/L (ref 98–111)
Creatinine, Ser: 1.05 mg/dL (ref 0.61–1.24)
GFR calc Af Amer: >60 mL/min (ref >60)
GFR calc non Af Amer: >60 mL/min (ref >60)
Glucose, Bld: 115 mg/dL — ABNORMAL HIGH (ref 70–99)
Potassium: 4.1 mmol/L (ref 3.5–5.1)
Sodium: 142 mmol/L (ref 135–145)

## 2019-04-21 LAB — URINALYSIS, ROUTINE W REFLEX MICROSCOPIC
Bilirubin Urine: NEGATIVE
Glucose, UA: NEGATIVE mg/dL
Ketones, ur: NEGATIVE mg/dL
Leukocytes,Ua: NEGATIVE
Nitrite: NEGATIVE
Protein, ur: NEGATIVE mg/dL
Specific Gravity, Urine: 1.02 (ref 1.005–1.030)
pH: 7.5 (ref 5.0–8.0)

## 2019-04-21 MED ORDER — OXYCODONE-ACETAMINOPHEN 5-325 MG PO TABS: 1.0000 | ORAL_TABLET | ORAL | 0 refills | Status: DC | PRN

## 2019-04-21 MED ORDER — ONDANSETRON HCL 4 MG PO TABS: 4.0000 mg | ORAL_TABLET | Freq: Four times a day (QID) | ORAL | 0 refills | Status: DC | PRN

## 2019-04-21 MED ORDER — HYDROMORPHONE HCL 1 MG/ML IJ SOLN
0.5000 mg | Freq: Once | INTRAMUSCULAR | Status: AC
  Administered 2019-04-21: 0.5 mg via INTRAVENOUS
  Filled 2019-04-21: qty 1

## 2019-04-21 MED ORDER — KETOROLAC TROMETHAMINE 15 MG/ML IJ SOLN
INTRAMUSCULAR | Status: AC
  Filled 2019-04-21: qty 1

## 2019-04-21 MED ORDER — KETOROLAC TROMETHAMINE 15 MG/ML IJ SOLN
15.0000 mg | Freq: Once | INTRAMUSCULAR | Status: AC
  Administered 2019-04-21: 08:00:00 15 mg via INTRAVENOUS

## 2019-04-21 MED ORDER — MORPHINE SULFATE (PF) 4 MG/ML IV SOLN
INTRAVENOUS | Status: AC
  Filled 2019-04-21: qty 1

## 2019-04-21 MED ORDER — MORPHINE SULFATE (PF) 4 MG/ML IV SOLN
4.0000 mg | Freq: Once | INTRAVENOUS | Status: AC
  Administered 2019-04-21: 08:00:00 4 mg via INTRAVENOUS

## 2019-04-21 NOTE — ED Triage Notes (Signed)
Right flank pain started this morning.  Nauseous and vomited x 1 this monring.

## 2019-04-21 NOTE — Discharge Instructions (Signed)
Take 600 mg of ibuprofen every 6 hours as needed for pain in addition to prescribed medications.

## 2019-04-21 NOTE — ED Notes (Signed)
When patient came, patient was in excruciating pain.  Morphine and Toradol given with great relief.

## 2019-04-25 ENCOUNTER — Telehealth: Payer: Self-pay | Admitting: Internal Medicine

## 2019-04-25 NOTE — Telephone Encounter (Signed)
Patient was seen in the ER with severe pain, has ureteral stone. Please call patient, is he  going to see urology?  Does he need a referral or a visit here?

## 2019-04-26 NOTE — Telephone Encounter (Signed)
Spoke w/ Pt- he saw [facility] on Friday 04/23/19- he has a 2 week f/u scheduled for another ultrasound. He is doing better but unsure if he passed stone- they are still having him strain urine.

## 2019-04-26 NOTE — Telephone Encounter (Signed)
thx

## 2019-04-27 NOTE — ED Provider Notes (Signed)
[facility] EMERGENCY DEPARTMENT Provider Note   CSN: [id number] Arrival date & time: 04/21/19  0732     History Chief Complaint  Patient presents with  . Flank Pain    [name] [name] is a 64 y.o. male.  HPI   64 year old male with right flank pain.  Acute onset this morning.  Pain is severe.  Nauseated and vomited once.  Distant history of kidney stones but not in several years.  Pain feels similar.  No fevers or chills.  No specific urinary complaints.  Went to sleep and usual state health.  Past Medical History:  Diagnosis Date  . Allergic rhinitis   . Allergy   . History of kidney stones   . Hyperlipidemia   . 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 Left lateral epicondylitis 04/14/2018  . PCP NOTES >>>>>>>>>>>>> 03/19/2016  . Annual physical exam 03/20/2015  . Allergic rhinitis 03/15/2013  . Hyperlipidemia 12/16/2007  . NEPHROLITHIASIS, HX OF 12/16/2007    Past Surgical History:  Procedure Laterality Date  . EYE SURGERY  2000   PRK bilateral  . HERNIA REPAIR  1995   RIH  . HERNIA REPAIR  03/21/11   Lap BIH w/mesh, Dr Abbey Chatters  . LITHOTRIPSY  2006  . TONSILLECTOMY AND ADENOIDECTOMY  1963  . VASECTOMY  1997       Family History  Problem Relation Age of Onset  . Heart disease Mother        mitral valve prolapse  . Cancer Mother        uterine ; stage 1 melanoma of left shoulder   . Dementia Mother   . Cancer Father        liver and pancreatic(NOT colon)  . Diabetes Father        due to pancreatic disease  . Pancreatic cancer Father   . Hyperlipidemia Brother   . Diabetes Brother   . Hypertension Brother   . Alzheimer's disease Maternal Aunt   . Cancer Paternal Uncle        stomach   . Stroke Neg Hx   . Colon cancer Neg Hx   . Prostate cancer Neg Hx   . Esophageal cancer Neg Hx   . Rectal cancer Neg Hx   . Stomach cancer Neg Hx     Social History    Tobacco Use  . Smoking status: Never Smoker  . Smokeless tobacco: Never Used  Substance Use Topics  . Alcohol use: Yes    Alcohol/week: 1.0 standard drinks    Types: 1 Glasses of wine per week    Comment: rare wine  . Drug use: No    Home Medications Prior to Admission medications   Medication Sig Start Date End Date Taking? Authorizing Provider  aspirin 81 MG tablet Take 160 mg by mouth daily.      [provider]  budesonide-formoterol (SYMBICORT) 80-4.5 MCG/ACT inhaler Inhale 2 puffs into the lungs 2 (two) times daily as needed.    [provider]  cetirizine (ZYRTEC) 10 MG tablet Take 10 mg by mouth daily.    [provider]  ondansetron (ZOFRAN) 4 MG tablet Take 1 tablet (4 mg total) by mouth every 6 (six) hours as needed for nausea or vomiting. 04/21/19   Raeford Razor, MD  oxyCODONE-acetaminophen (PERCOCET/ROXICET) 5-325 MG tablet Take 1 tablet by mouth every 4 (four) hours as needed  for severe pain. 04/21/19   Virgel Manifold, MD  pravastatin (PRAVACHOL) 10 MG tablet Take 1 tablet (10 mg total) by mouth daily. 03/29/19   Colon Branch, MD  PROAIR [facility] 108 [phone] Base) MCG/ACT inhaler Reported on 09/14/2015 01/09/15   [provider]    Allergies    Patient has no known allergies.  Review of Systems   Review of Systems All systems reviewed and negative, other than as noted in HPI.  Physical Exam. Updated Vital Signs BP 128/77 (BP Location: Right Arm)   Pulse (!) 59   Temp 98.4 F (36.9 C) (Oral)   Resp 16   Ht 5\' 10"  (1.778 m)   Wt 70.3 kg   SpO2 100%   BMI 22.24 kg/m   Physical Exam Vitals and nursing note reviewed.  Constitutional:      Appearance: He is well-developed.     Comments: Laying on Biomedical scientist.  Appears extremely uncomfortable.  Shifting position frequently.  HENT:     Head: Normocephalic and atraumatic.  Eyes:     General:        Right eye: No discharge.        Left eye: No discharge.     Conjunctiva/sclera:  Conjunctivae normal.  Cardiovascular:     Rate and Rhythm: Normal rate and regular rhythm.     Heart sounds: Normal heart sounds. No murmur. No friction rub. No gallop.   Pulmonary:     Effort: Pulmonary effort is normal. No respiratory distress.     Breath sounds: Normal breath sounds.  Abdominal:     General: There is no distension.     Palpations: Abdomen is soft.     Tenderness: There is no abdominal tenderness.  Musculoskeletal:        General: No tenderness.     Cervical back: Neck supple.  Skin:    General: Skin is warm and dry.  Neurological:     Mental Status: He is alert.  Psychiatric:        Behavior: Behavior normal.        Thought Content: Thought content normal.     ED Results / Procedures / Treatments   Labs (all labs ordered are listed, but only abnormal results are displayed) Labs Reviewed  CBC WITH DIFFERENTIAL/PLATELET - Abnormal; Notable for the following components:      Result Value   Eosinophils Absolute 0.6 (*)    All other components within normal limits  BASIC METABOLIC PANEL - Abnormal; Notable for the following components:   Glucose, Bld 115 (*)    All other components within normal limits  URINALYSIS, ROUTINE W REFLEX MICROSCOPIC - Abnormal; Notable for the following components:   Hgb urine dipstick LARGE (*)    All other components within normal limits  URINALYSIS, MICROSCOPIC (REFLEX) - Abnormal; Notable for the following components:   Bacteria, UA RARE (*)    All other components within normal limits    EKG None  Radiology No results found.   CT Renal Stone Study  Result Date: 04/21/2019 CLINICAL DATA:  Right flank pain and nausea and vomiting beginning this morning. Nephrolithiasis. EXAM: CT ABDOMEN AND PELVIS WITHOUT CONTRAST TECHNIQUE: Multidetector CT imaging of the abdomen and pelvis was performed following the standard protocol without IV contrast. COMPARISON:  07/23/2006 from [facility] FINDINGS: Lower chest: No acute  findings. Hepatobiliary: No mass visualized on this unenhanced exam. Gallbladder is unremarkable. No evidence of biliary ductal dilatation. Pancreas: No mass or inflammatory process visualized on  this unenhanced exam. Spleen:  Within normal limits in size. Adrenals/Urinary tract: Mild right hydroureteronephrosis is seen due to a 4 mm calculus in the distal right ureter. Stomach/Bowel: No evidence of obstruction, inflammatory process, or abnormal fluid collections. Normal appendix visualized. Diverticulosis is seen mainly involving the sigmoid colon, however there is no evidence of diverticulitis. Vascular/Lymphatic: No pathologically enlarged lymph nodes identified. No evidence of abdominal aortic aneurysm. Reproductive:  No mass or other significant abnormality. Other: Surgical mesh noted in both inguinal regions. No evidence of recurrent hernia. Musculoskeletal:  No suspicious bone lesions identified. IMPRESSION: 1. Mild right hydroureteronephrosis due to 4 mm distal right ureteral calculus. 2. Colonic diverticulosis. No radiographic evidence of diverticulitis. Electronically Signed   By: Danae Orleans M.D.   On: 04/21/2019 08:41     Procedures Procedures (including critical care time)  Medications Ordered in ED Medications  ketorolac (TORADOL) 15 MG/ML injection 15 mg (15 mg Intravenous Given 04/21/19 0753)  morphine 4 MG/ML injection 4 mg (4 mg Intravenous Given 04/21/19 0753)  HYDROmorphone (DILAUDID) injection 0.5 mg (0.5 mg Intravenous Given 04/21/19 7948)    ED Course  I have reviewed the triage vital signs and the nursing notes.  Pertinent labs & imaging results that were available during my care of the patient were reviewed by me and considered in my medical decision making (see chart for details).    MDM Rules/Calculators/A&P                      64 year old male with right ureteral stone.  Symptoms now well controlled.  Labs fairly reassuring.  Continued symptomatic treatment/expectant  management.  Return precautions were discussed.  Urology follow-up otherwise.  Final Clinical Impression(s) / ED Diagnoses Final diagnoses:  Right ureteral stone    Rx / DC Orders ED Discharge Orders         Ordered    oxyCODONE-acetaminophen (PERCOCET/ROXICET) 5-325 MG tablet  Every 4 hours PRN     04/21/19 1038    ondansetron (ZOFRAN) 4 MG tablet  Every 6 hours PRN     04/21/19 1038           Raeford Razor, MD 04/27/19 [phone]

## 2019-06-23 ENCOUNTER — Other Ambulatory Visit: Payer: Self-pay

## 2019-06-23 ENCOUNTER — Other Ambulatory Visit (INDEPENDENT_AMBULATORY_CARE_PROVIDER_SITE_OTHER): Payer: BC Managed Care – PPO

## 2019-06-23 DIAGNOSIS — E785 Hyperlipidemia, unspecified: Secondary | ICD-10-CM

## 2019-06-23 LAB — HEPATIC FUNCTION PANEL
ALT: 16 U/L (ref 0–53)
AST: 17 U/L (ref 0–37)
Albumin: 4.2 g/dL (ref 3.5–5.2)
Alkaline Phosphatase: 47 U/L (ref 39–117)
Bilirubin, Direct: 0.1 mg/dL (ref 0.0–0.3)
Total Bilirubin: 0.4 mg/dL (ref 0.2–1.2)
Total Protein: 6.4 g/dL (ref 6.0–8.3)

## 2019-06-23 LAB — LIPID PANEL
Cholesterol: 204 mg/dL — ABNORMAL HIGH (ref 0–200)
HDL: 63.2 mg/dL (ref >39.00)
LDL Cholesterol: 128 mg/dL — ABNORMAL HIGH (ref 0–99)
NonHDL: 140.73
Total CHOL/HDL Ratio: 3
Triglycerides: 64 mg/dL (ref 0.0–149.0)
VLDL: 12.8 mg/dL (ref 0.0–40.0)

## 2019-06-26 ENCOUNTER — Encounter: Payer: Self-pay | Admitting: Internal Medicine

## 2019-06-26 DIAGNOSIS — E785 Hyperlipidemia, unspecified: Secondary | ICD-10-CM

## 2019-06-28 MED ORDER — PRAVASTATIN SODIUM 20 MG PO TABS: 20.0000 mg | ORAL_TABLET | Freq: Every day | ORAL | 6 refills | Status: DC

## 2019-09-02 DIAGNOSIS — G8929 Other chronic pain: Secondary | ICD-10-CM | POA: Insufficient documentation

## 2019-12-05 ENCOUNTER — Encounter: Payer: Self-pay | Admitting: Internal Medicine

## 2019-12-05 DIAGNOSIS — E785 Hyperlipidemia, unspecified: Secondary | ICD-10-CM

## 2019-12-05 DIAGNOSIS — N400 Enlarged prostate without lower urinary tract symptoms: Secondary | ICD-10-CM

## 2019-12-13 ENCOUNTER — Other Ambulatory Visit (INDEPENDENT_AMBULATORY_CARE_PROVIDER_SITE_OTHER): Payer: BC Managed Care – PPO

## 2019-12-13 ENCOUNTER — Other Ambulatory Visit: Payer: Self-pay

## 2019-12-13 DIAGNOSIS — E785 Hyperlipidemia, unspecified: Secondary | ICD-10-CM

## 2019-12-13 DIAGNOSIS — N400 Enlarged prostate without lower urinary tract symptoms: Secondary | ICD-10-CM

## 2019-12-13 LAB — LIPID PANEL
Cholesterol: 205 mg/dL — ABNORMAL HIGH (ref <200)
HDL: 72 mg/dL (ref >=40)
LDL Cholesterol (Calc): 119 mg/dL (calc) — ABNORMAL HIGH
Non-HDL Cholesterol (Calc): 133 mg/dL (calc) — ABNORMAL HIGH (ref <130)
Total CHOL/HDL Ratio: 2.8 (calc) (ref <5.0)
Triglycerides: 51 mg/dL (ref <150)

## 2019-12-13 LAB — AST: AST: 15 U/L (ref 10–35)

## 2019-12-13 LAB — PSA: PSA: 1.97 ng/mL (ref <=4.0)

## 2019-12-13 LAB — ALT: ALT: 19 U/L (ref 9–46)

## 2019-12-20 MED ORDER — PRAVASTATIN SODIUM 20 MG PO TABS: 20.0000 mg | ORAL_TABLET | Freq: Every day | ORAL | 3 refills | Status: DC

## 2019-12-20 NOTE — Addendum Note (Signed)
Addended by: Adaleah Forget D on: 12/20/2019 01:15 PM   Modules accepted: Orders  

## 2020-03-14 ENCOUNTER — Encounter: Payer: Self-pay | Admitting: Internal Medicine

## 2020-03-20 ENCOUNTER — Encounter: Payer: BC Managed Care – PPO | Admitting: Internal Medicine

## 2020-04-24 ENCOUNTER — Encounter: Payer: Self-pay | Admitting: Internal Medicine

## 2020-04-24 ENCOUNTER — Ambulatory Visit (INDEPENDENT_AMBULATORY_CARE_PROVIDER_SITE_OTHER): Payer: BC Managed Care – PPO | Admitting: Internal Medicine

## 2020-04-24 ENCOUNTER — Other Ambulatory Visit: Payer: Self-pay

## 2020-04-24 VITALS — BP 126/64 | HR 67 | Temp 98.0°F | Resp 16 | Ht 70.0 in | Wt 173.4 lb

## 2020-04-24 DIAGNOSIS — R739 Hyperglycemia, unspecified: Secondary | ICD-10-CM | POA: Diagnosis not present

## 2020-04-24 DIAGNOSIS — D649 Anemia, unspecified: Secondary | ICD-10-CM

## 2020-04-24 DIAGNOSIS — E785 Hyperlipidemia, unspecified: Secondary | ICD-10-CM

## 2020-04-24 DIAGNOSIS — Z0000 Encounter for general adult medical examination without abnormal findings: Secondary | ICD-10-CM | POA: Diagnosis not present

## 2020-04-24 NOTE — Progress Notes (Signed)
Subjective:    Patient ID: [name] [name], male    DOB: 08-02-1955, 65 y.o.   MRN: [id number]  DOS:  04/24/2020 Type of visit - description: CPX Doing well, has no symptoms or concerns  Wt Readings from Last 3 Encounters:  04/24/20 173 lb 6 oz (78.6 kg)  04/21/19 155 lb (70.3 kg)  03/22/19 170 lb 4 oz (77.2 kg)     Review of Systems  A 14 point review of systems is negative     Past Medical History:  Diagnosis Date  . Allergic rhinitis   . Allergy   . History of kidney stones   . Hyperlipidemia   . 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 EYE SURGERY  2000   PRK bilateral  . HERNIA REPAIR  1995   [facility]  . HERNIA REPAIR  03/21/11   Lap BIH w/mesh, Dr Abbey Chatters  . LITHOTRIPSY  2006  . TONSILLECTOMY AND ADENOIDECTOMY  1963  . VASECTOMY  1997    Allergies as of 04/24/2020   No Known Allergies     Medication List       Accurate as of April 24, 2020 11:59 PM. If you have any questions, ask your nurse or doctor.        STOP taking these medications   budesonide-formoterol 80-4.5 MCG/ACT inhaler Commonly known as: SYMBICORT Stopped by: Willow Ora, MD   ondansetron 4 MG tablet Commonly known as: ZOFRAN Stopped by: Willow Ora, MD   oxyCODONE-acetaminophen 5-325 MG tablet Commonly known as: PERCOCET/ROXICET Stopped by: Willow Ora, MD     TAKE these medications   aspirin 81 MG tablet Take 160 mg by mouth daily.   cetirizine 10 MG tablet Commonly known as: ZYRTEC Take 10 mg by mouth daily.   pravastatin 20 MG tablet Commonly known as: PRAVACHOL Take 1 tablet (20 mg total) by mouth daily.   ProAir HFA 108 (90 Base) MCG/ACT inhaler Generic drug: albuterol Reported on 09/14/2015          Objective:   Physical Exam BP 126/64 (BP Location: Left Arm, Patient Position: Sitting, Cuff Size: Small)   Pulse 67   Temp 98 F (36.7 C) (Oral)   Resp 16   Ht 5\' 10"  (1.778 m)   Wt 173  lb 6 oz (78.6 kg)   SpO2 97%   BMI 24.88 kg/m  General: Well developed, NAD, BMI noted Neck: No  thyromegaly  HEENT:  Normocephalic . Face symmetric, atraumatic Lungs:  CTA B Normal respiratory effort, no intercostal retractions, no accessory muscle use. Heart: RRR,  no murmur.  Abdomen:  Not distended, soft, non-tender. No rebound or rigidity.   Lower extremities: no pretibial edema bilaterally  Skin: Exposed areas without rash. Not pale. Not jaundice Neurologic:  alert & oriented X3.  Speech normal, gait appropriate for age and unassisted Strength symmetric and appropriate for age.  Psych: Cognition and judgment appear intact.  Cooperative with normal attention span and concentration.  Behavior appropriate. No anxious or depressed appearing.     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 Bronchospasm, history of, has not used inhaler in years Nephrolithiasis: Passed a  stone few months ago, now asx Weight on 04/21/2019 155 pounds, likely an error, his normal weight is around 170s. Social: Retiring soon, planning to move to [place]. RTC 1 year  -Td 2017 - s/p shingrix -COVID VAX x3 - Had a flu shot  -CCS: Cscope normal 2007 per pt, Cscope 09-2015 ---tics, 10 years  -Prostate cancer screening: Saw urology, 11-2019, had a kidney stone, PSA was 1.11 December 2019 -Diet and exercise discussed. - labs : CMP, FLP, CBC, A1c, TSH      This visit occurred during the SARS-CoV-2 public health emergency.  Safety protocols were in place, including screening questions prior to the visit, additional usage of staff PPE, and extensive cleaning of exam room while observing appropriate contact time as indicated for disinfecting  solutions.

## 2020-04-24 NOTE — Patient Instructions (Addendum)
Is a good idea to bring a copy of your healthcare power of attorney to have on file.  GO TO THE LAB : Get the blood work     GO TO THE FRONT DESK, PLEASE SCHEDULE YOUR APPOINTMENTS Come back for a physical exam in 1 year

## 2020-04-24 NOTE — Progress Notes (Signed)
Pre visit review using our clinic review tool, if applicable. No additional management support is needed unless otherwise documented below in the visit note. 

## 2020-04-25 ENCOUNTER — Encounter: Payer: Self-pay | Admitting: Internal Medicine

## 2020-04-25 ENCOUNTER — Telehealth: Payer: Self-pay

## 2020-04-25 LAB — COMPREHENSIVE METABOLIC PANEL
ALT: 23 U/L (ref 0–53)
AST: 19 U/L (ref 0–37)
Albumin: 4.3 g/dL (ref 3.5–5.2)
Alkaline Phosphatase: 45 U/L (ref 39–117)
BUN: 20 mg/dL (ref 6–23)
CO2: 27 mEq/L (ref 19–32)
Calcium: 9.3 mg/dL (ref 8.4–10.5)
Chloride: 108 mEq/L (ref 96–112)
Creatinine, Ser: 0.92 mg/dL (ref 0.40–1.50)
GFR: 87.88 mL/min (ref >60.00)
Glucose, Bld: 89 mg/dL (ref 70–99)
Potassium: 4.3 mEq/L (ref 3.5–5.1)
Sodium: 142 mEq/L (ref 135–145)
Total Bilirubin: 0.5 mg/dL (ref 0.2–1.2)
Total Protein: 6.5 g/dL (ref 6.0–8.3)

## 2020-04-25 LAB — CBC WITH DIFFERENTIAL/PLATELET
Basophils Absolute: 0 10*3/uL (ref 0.0–0.1)
Basophils Relative: 0.6 % (ref 0.0–3.0)
Eosinophils Absolute: 0.1 10*3/uL (ref 0.0–0.7)
Eosinophils Relative: 1.7 % (ref 0.0–5.0)
HCT: 37.5 % — ABNORMAL LOW (ref 39.0–52.0)
Hemoglobin: 12.3 g/dL — ABNORMAL LOW (ref 13.0–17.0)
Lymphocytes Relative: 18.2 % (ref 12.0–46.0)
Lymphs Abs: 1.3 10*3/uL (ref 0.7–4.0)
MCHC: 32.7 g/dL (ref 30.0–36.0)
MCV: 89.3 fl (ref 78.0–100.0)
Monocytes Absolute: 0.3 10*3/uL (ref 0.1–1.0)
Monocytes Relative: 5.1 % (ref 3.0–12.0)
Neutro Abs: 5.1 10*3/uL (ref 1.4–7.7)
Neutrophils Relative %: 74.4 % (ref 43.0–77.0)
Platelets: 340 10*3/uL (ref 150.0–400.0)
RBC: 4.19 Mil/uL — ABNORMAL LOW (ref 4.22–5.81)
RDW: 14 % (ref 11.5–15.5)
WBC: 6.9 10*3/uL (ref 4.0–10.5)

## 2020-04-25 LAB — LIPID PANEL
Cholesterol: 196 mg/dL (ref 0–200)
HDL: 69.3 mg/dL (ref >39.00)
LDL Cholesterol: 116 mg/dL — ABNORMAL HIGH (ref 0–99)
NonHDL: 126.45
Total CHOL/HDL Ratio: 3
Triglycerides: 53 mg/dL (ref 0.0–149.0)
VLDL: 10.6 mg/dL (ref 0.0–40.0)

## 2020-04-25 LAB — TSH: TSH: 1.67 u[IU]/mL (ref 0.35–4.50)

## 2020-04-25 LAB — HEMOGLOBIN A1C: Hgb A1c MFr Bld: 5.9 % (ref 4.6–6.5)

## 2020-04-25 NOTE — Assessment & Plan Note (Signed)
-  Td 2017 - s/p shingrix -COVID VAX x3 - Had a flu shot  -CCS: Cscope normal 2007 per pt, Cscope 09-2015 ---tics, 10 years  -Prostate cancer screening: Saw urology, 11-2019, had a kidney stone, PSA was 1.11 December 2019 -Diet and exercise discussed. - labs : CMP, FLP, CBC, A1c, TSH

## 2020-04-25 NOTE — Assessment & Plan Note (Signed)
Here for CPX Hyperlipidemia: On Pravachol, checking labs. Bronchospasm, history of, has not used inhaler in years Nephrolithiasis: Passed a stone few months ago, now asx Weight on 04/21/2019 155 pounds, likely an error, his normal weight is around 170s. Social: Retiring soon, planning to move to [place]. RTC 1 year

## 2020-04-25 NOTE — Telephone Encounter (Signed)
Pt aware that form is ready for pick up at front desk. Copy sent for scanning.

## 2020-05-01 NOTE — Addendum Note (Signed)
Addended by: Nysha Koplin D on: 05/01/2020 01:54 PM   Modules accepted: Orders  

## 2020-05-02 ENCOUNTER — Encounter: Payer: Self-pay | Admitting: Internal Medicine

## 2020-07-25 ENCOUNTER — Other Ambulatory Visit (INDEPENDENT_AMBULATORY_CARE_PROVIDER_SITE_OTHER): Payer: BC Managed Care – PPO

## 2020-07-25 ENCOUNTER — Other Ambulatory Visit: Payer: Self-pay

## 2020-07-25 DIAGNOSIS — D509 Iron deficiency anemia, unspecified: Secondary | ICD-10-CM

## 2020-07-25 DIAGNOSIS — D649 Anemia, unspecified: Secondary | ICD-10-CM | POA: Diagnosis not present

## 2020-07-25 LAB — CBC WITH DIFFERENTIAL/PLATELET
Basophils Absolute: 0 10*3/uL (ref 0.0–0.1)
Basophils Relative: 0.6 % (ref 0.0–3.0)
Eosinophils Absolute: 0.5 10*3/uL (ref 0.0–0.7)
Eosinophils Relative: 7.8 % — ABNORMAL HIGH (ref 0.0–5.0)
HCT: 39 % (ref 39.0–52.0)
Hemoglobin: 12.9 g/dL — ABNORMAL LOW (ref 13.0–17.0)
Lymphocytes Relative: 23 % (ref 12.0–46.0)
Lymphs Abs: 1.5 10*3/uL (ref 0.7–4.0)
MCHC: 33.1 g/dL (ref 30.0–36.0)
MCV: 87.1 fl (ref 78.0–100.0)
Monocytes Absolute: 0.5 10*3/uL (ref 0.1–1.0)
Monocytes Relative: 7.2 % (ref 3.0–12.0)
Neutro Abs: 4.1 10*3/uL (ref 1.4–7.7)
Neutrophils Relative %: 61.4 % (ref 43.0–77.0)
Platelets: 348 10*3/uL (ref 150.0–400.0)
RBC: 4.48 Mil/uL (ref 4.22–5.81)
RDW: 13.8 % (ref 11.5–15.5)
WBC: 6.7 10*3/uL (ref 4.0–10.5)

## 2020-07-25 LAB — FERRITIN: Ferritin: 7.2 ng/mL — ABNORMAL LOW (ref 22.0–322.0)

## 2020-07-25 LAB — IRON: Iron: 58 ug/dL (ref 42–165)

## 2020-07-26 ENCOUNTER — Encounter: Payer: Self-pay | Admitting: Gastroenterology

## 2020-07-26 NOTE — Addendum Note (Signed)
Addended byConrad Ferney D on: 07/26/2020 01:01 PM   Modules accepted: Orders

## 2020-08-10 ENCOUNTER — Other Ambulatory Visit: Payer: Self-pay | Admitting: Internal Medicine

## 2020-08-13 ENCOUNTER — Encounter: Payer: Self-pay | Admitting: Internal Medicine

## 2020-08-23 ENCOUNTER — Ambulatory Visit (INDEPENDENT_AMBULATORY_CARE_PROVIDER_SITE_OTHER): Payer: BC Managed Care – PPO | Admitting: Gastroenterology

## 2020-08-23 ENCOUNTER — Encounter: Payer: Self-pay | Admitting: Gastroenterology

## 2020-08-23 VITALS — BP 130/72 | HR 65 | Ht 70.0 in | Wt 175.8 lb

## 2020-08-23 DIAGNOSIS — D509 Iron deficiency anemia, unspecified: Secondary | ICD-10-CM | POA: Insufficient documentation

## 2020-08-23 DIAGNOSIS — R790 Abnormal level of blood mineral: Secondary | ICD-10-CM | POA: Diagnosis not present

## 2020-08-23 NOTE — Patient Instructions (Addendum)
If you are age 65 or older, your body mass index should be between 23-30. Your Body mass index is 25.22 kg/m. If this is out of the aforementioned range listed, please consider follow up with your Primary Care Provider.  If you are age 38 or younger, your body mass index should be between 19-25. Your Body mass index is 25.22 kg/m. If this is out of the aformentioned range listed, please consider follow up with your Primary Care Provider.   Take Ferrous Sulfate 325 mg daily.   Repeat labs in September.   The [place] [facility] providers would like to encourage you to use [facility] to communicate with providers for non-urgent requests or questions.  Due to long hold times on the telephone, sending your provider a message by [facility] may be a faster and more efficient way to get a response.  Please allow 48 business hours for a response.  Please remember that this is for non-urgent requests.   Thank you for choosing me and [facility].  Doug Sou, PA-C

## 2020-08-23 NOTE — Progress Notes (Signed)
08/23/2020 [name] [name] [id number] [birth year]   HISTORY OF PRESENT ILLNESS: This is a pleasant 65 year old male is a patient of Dr. Marvell Fuller.  He is known him only for colonoscopies.  Colonoscopy July 2017 showed only severe diverticulosis in the sigmoid colon.  Colonoscopy September 2007 was completely normal.  He is here today at the request of his PCP, Dr. Drue Novel, for evaluation of iron deficiency anemia.  He had labs performed in February where he was found to have a hemoglobin of 12.3 g.  MCV was normal.  This is compared to 13.6 g one year prior.  Then on repeat labs again about 1 month ago, in May, hemoglobin increased to 12.9 g.  At that time iron studies were performed and showed a low normal iron level at 58, and a low ferritin at 7.2.  At this point he has not been placed on any type of iron supplementation.  He tells me that back in October he had given blood as a "double red" donation.  Then he gave blood again, just a regular donation, in February, only 11 days prior to his February blood draw.  He denies absolutely any GI complaints.  He denies any sign of black or bloody stools.  He says that he feels great and has no complaints in general.   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 He has never used smokeless tobacco. He reports current alcohol use of about 1.0 standard drink of alcohol per week. He reports that he does not use drugs. family history includes Alzheimer's disease in his maternal aunt; Cancer in his father, mother, and  paternal uncle; Dementia in his mother; Diabetes in his brother and father; Heart disease in his mother; Hyperlipidemia in his brother; Hypertension in his brother; Pancreatic cancer in his father. No Known Allergies    Outpatient Encounter Medications as of 08/23/2020  Medication Sig   aspirin 81 MG tablet Take 81 mg by mouth daily.   cetirizine (ZYRTEC) 10 MG tablet Take 10 mg by mouth daily.   pravastatin (PRAVACHOL) 20 MG tablet Take 1 tablet (20 mg total) by mouth daily.   [DISCONTINUED] PROAIR HFA 108 (90 Base) MCG/ACT inhaler Reported on 09/14/2015 (Patient not taking: No sig reported)   No facility-administered encounter medications on file as of 08/23/2020.     REVIEW OF SYSTEMS  : All other systems reviewed and negative except where noted in the History of Present Illness.   PHYSICAL EXAM: BP 130/72   Pulse 65   Ht 5\' 10"  (1.778 m)   Wt 175 lb 12.8 oz (79.7 kg)   BMI 25.22 kg/m  General: Well developed white male in no acute distress Head: Normocephalic and atraumatic Eyes:  Sclerae anicteric, conjunctiva pink. Ears: Normal auditory acuity  Lungs: Clear throughout to auscultation; no W/R/R. Heart: Regular rate and rhythm; no M/R/G. Abdomen: Soft, non-distended.  BS present.  Non-tender. Musculoskeletal:  Symmetrical with no gross deformities  Skin: No lesions on visible extremities Extremities: No edema  Neurological: Alert oriented x 4, grossly non-focal Psychological:  Alert and cooperative. Normal mood and affect  ASSESSMENT AND PLAN: *Mild iron deficiency/low ferritin level: Patient denies any signs of GI bleeding.  In fact, he denies any GI complaints at all.  He has had 2 colonoscopies, the last 5 years ago that have both been without any polyps.  No upper GI complaints warranting EGD.  Has not been placed on any iron supplements at this point.  Had given double red blood transfusion back in October 2021 and then gave blood again just 11 days prior to his lab draw  in February.  I think that likely contributed to his issue.  Likely has just been using up his iron stores.  We discussed performing endoscopy.  I think both endoscopy and definitely colonoscopy are going to be low yield to find any cause of this.  We are going to start just by placing him on ferrous sulfate 325 mg once daily for the next 8-12 weeks.  He should then come here for repeat labs including CBC and iron studies.  If labs are improved with the iron support, then I do not know if there is any need to proceed with further GI evaluation at this time.  He obviously will not plan to give blood in the interim. CC:  Wanda Plump, MD

## 2020-10-11 IMAGING — CT CT RENAL STONE PROTOCOL
2 of 4 series · 16 of 46 positions shown, 18 images · non-contrast
Comparison: 07/23/2006 from [HOSPITAL]

CLINICAL DATA: Right flank pain and nausea and vomiting beginning
this morning. Nephrolithiasis.

EXAM:
CT ABDOMEN AND PELVIS WITHOUT CONTRAST
TECHNIQUE: Multidetector CT imaging of the abdomen and pelvis was performed
following the standard protocol without IV contrast.

[Series 2: axial st · axial · 0.90mm/px · z∈[-569,-89]mm · 13 of 104 slices shown, 15 images]
[im 4/104  soft-tissue]
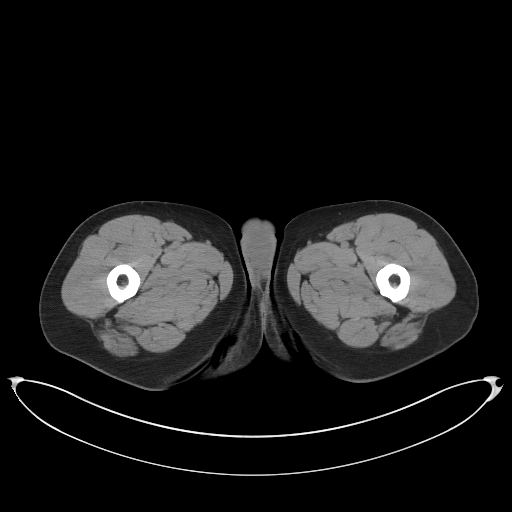
[im 4/104  bone]
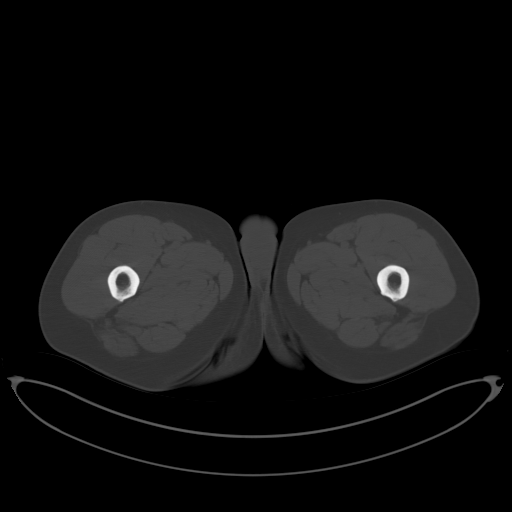
[im 12/104  soft-tissue]
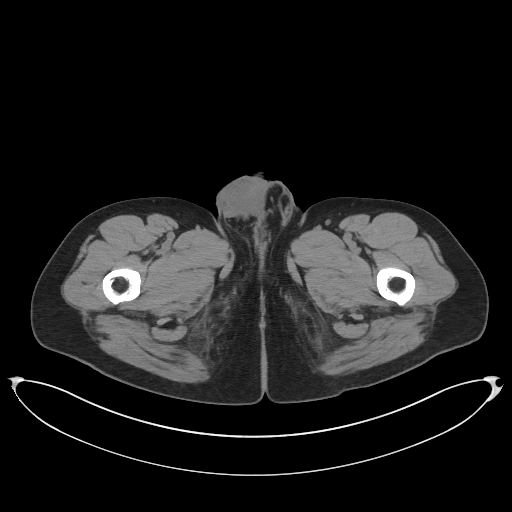
[im 20/104  soft-tissue]
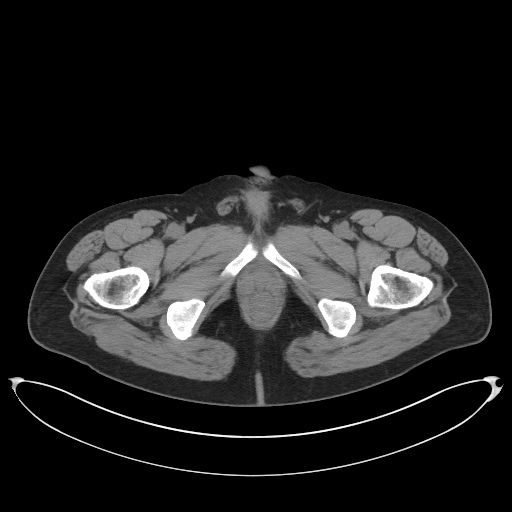
[im 28/104  soft-tissue]
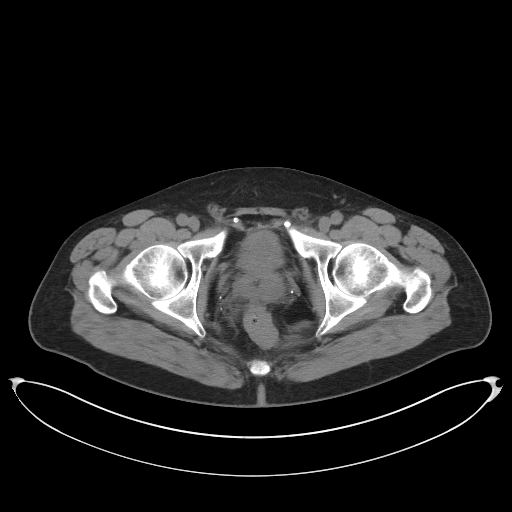
[im 36/104  soft-tissue]
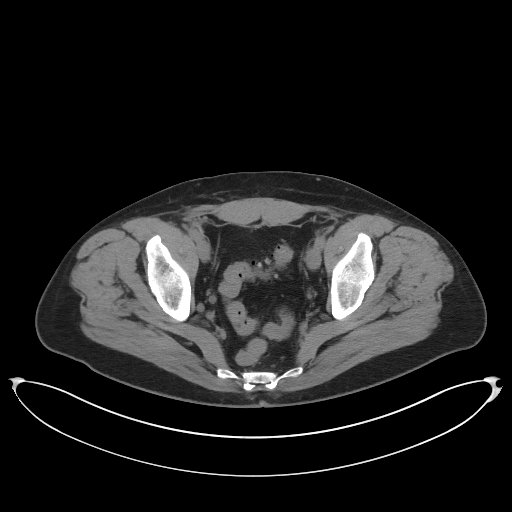
[im 44/104  soft-tissue]
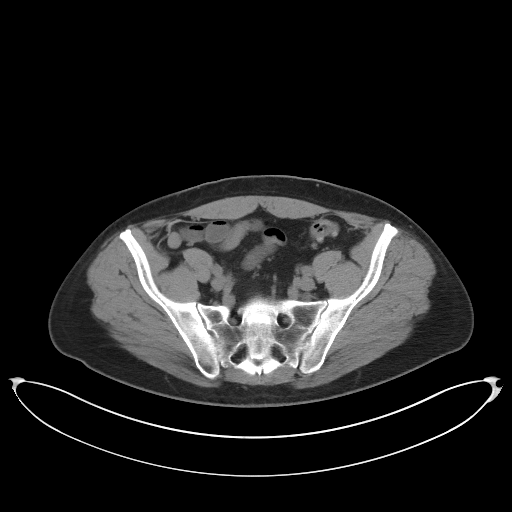
[im 52/104  soft-tissue]
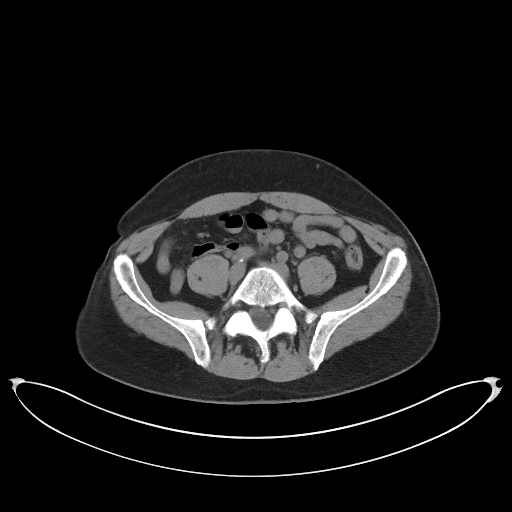
[im 60/104  soft-tissue]
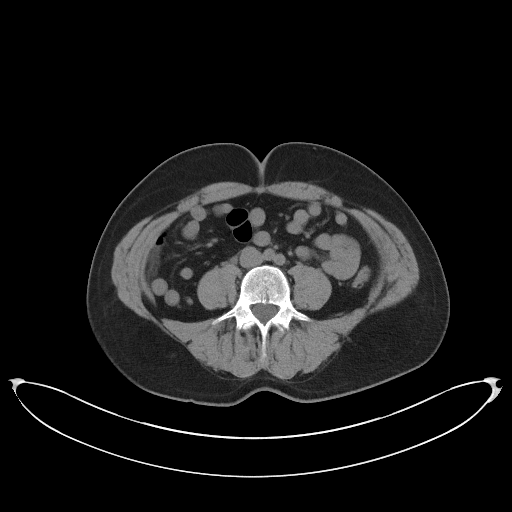
[im 68/104  soft-tissue]
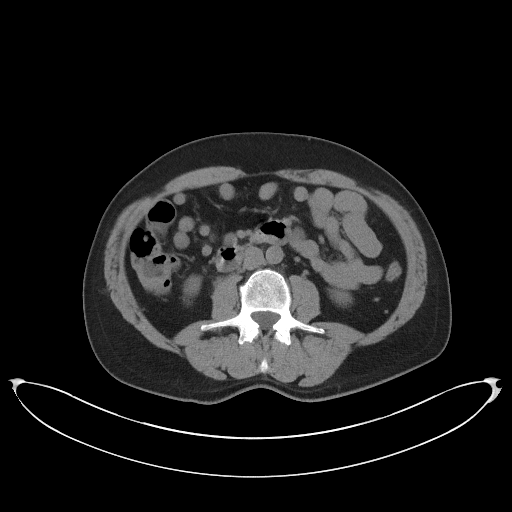
[im 68/104  bone]
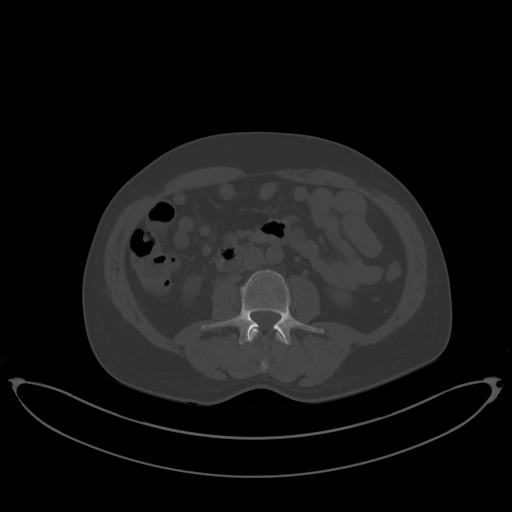
[im 76/104  soft-tissue]
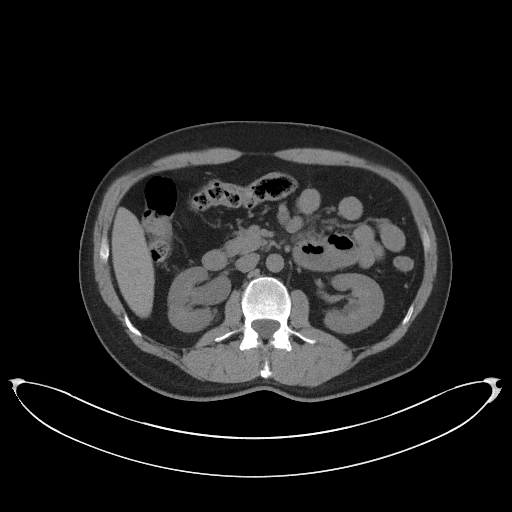
[im 84/104  soft-tissue]
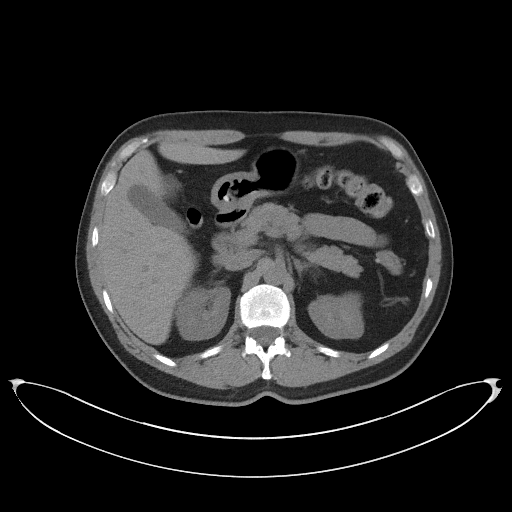
[im 92/104  soft-tissue]
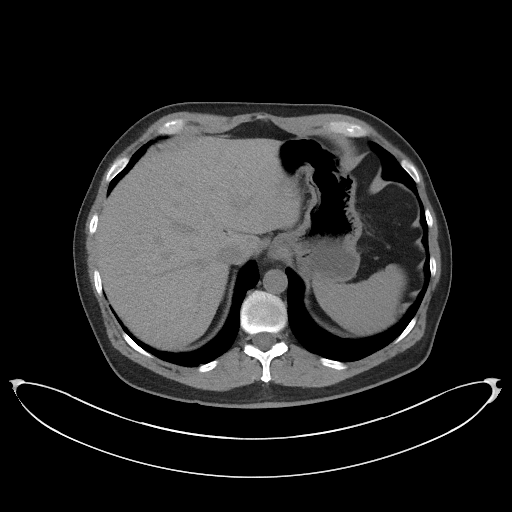
[im 100/104  soft-tissue]
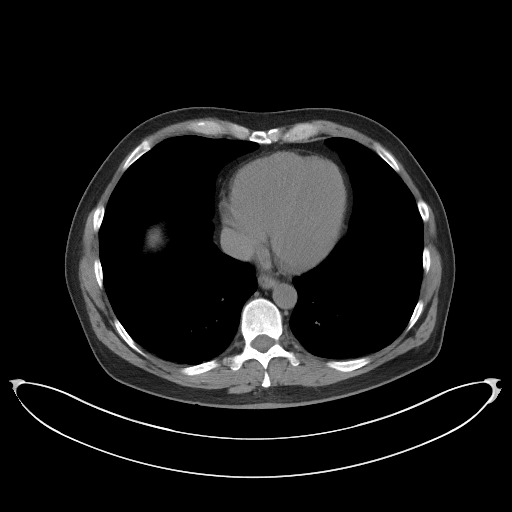

[Series 5: coronal st · coronal · 0.86mm/px · 3 of 92 slices shown]
[im 31/92  soft-tissue]
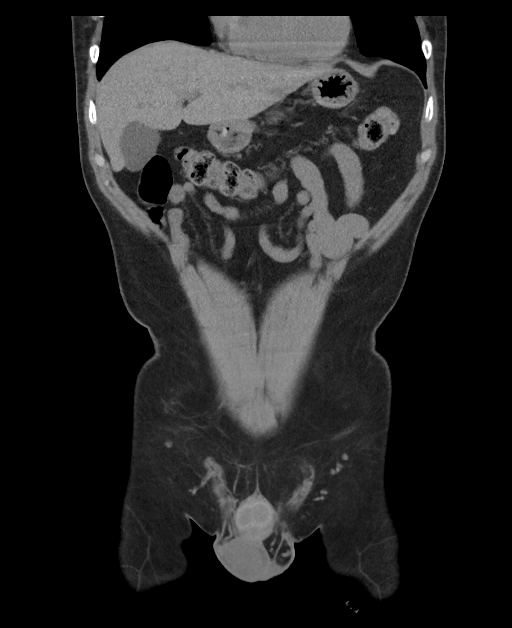
[im 41/92  soft-tissue]
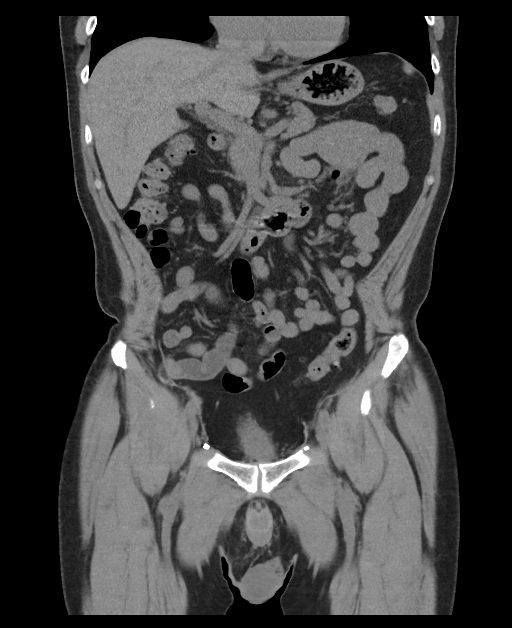
[im 51/92  soft-tissue]
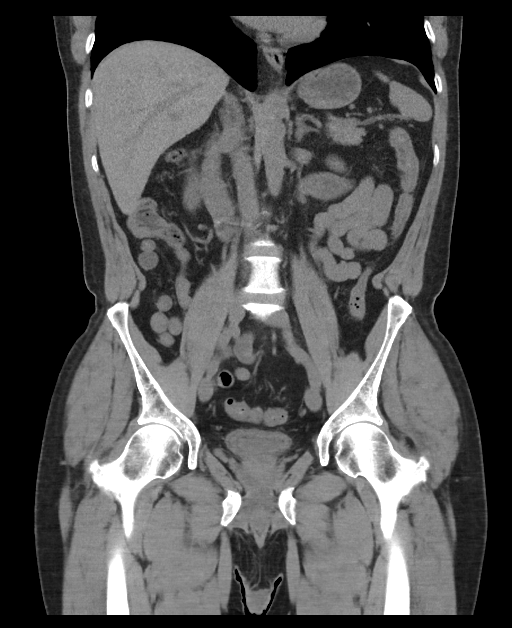

[16 of 46 positions shown; findings below may reference images not displayed]

FINDINGS: Lower chest: No acute findings.

Hepatobiliary: No mass visualized on this unenhanced exam.
Gallbladder is unremarkable. No evidence of biliary ductal
dilatation.

Pancreas: No mass or inflammatory process visualized on this
unenhanced exam.

Spleen:  Within normal limits in size.

Adrenals/Urinary tract: Mild right hydroureteronephrosis is seen due
to a 4 mm calculus in the distal right ureter.

Stomach/Bowel: No evidence of obstruction, inflammatory process, or
abnormal fluid collections. Normal appendix visualized.
Diverticulosis is seen mainly involving the sigmoid colon, however
there is no evidence of diverticulitis.

Vascular/Lymphatic: No pathologically enlarged lymph nodes
identified. No evidence of abdominal aortic aneurysm.

Reproductive:  No mass or other significant abnormality.

Other: Surgical mesh noted in both inguinal regions. No evidence of
recurrent hernia.

Musculoskeletal:  No suspicious bone lesions identified.
IMPRESSION: 1. Mild right hydroureteronephrosis due to 4 mm distal right
ureteral calculus.
2. Colonic diverticulosis. No radiographic evidence of
diverticulitis.

## 2020-11-27 ENCOUNTER — Other Ambulatory Visit (INDEPENDENT_AMBULATORY_CARE_PROVIDER_SITE_OTHER): Payer: BC Managed Care – PPO

## 2020-11-27 DIAGNOSIS — D509 Iron deficiency anemia, unspecified: Secondary | ICD-10-CM

## 2020-11-27 DIAGNOSIS — R790 Abnormal level of blood mineral: Secondary | ICD-10-CM | POA: Diagnosis not present

## 2020-11-27 LAB — IBC PANEL
Iron: 88 ug/dL (ref 42–165)
Saturation Ratios: 24.7 % (ref 20.0–50.0)
TIBC: 355.6 ug/dL (ref 250.0–450.0)
Transferrin: 254 mg/dL (ref 212.0–360.0)

## 2020-11-27 LAB — FERRITIN: Ferritin: 23.6 ng/mL (ref 22.0–322.0)

## 2020-11-28 ENCOUNTER — Other Ambulatory Visit: Payer: Self-pay

## 2020-11-28 DIAGNOSIS — D509 Iron deficiency anemia, unspecified: Secondary | ICD-10-CM

## 2020-11-28 DIAGNOSIS — R790 Abnormal level of blood mineral: Secondary | ICD-10-CM

## 2020-12-07 DIAGNOSIS — R69 Illness, unspecified: Secondary | ICD-10-CM | POA: Diagnosis not present

## 2021-01-10 ENCOUNTER — Other Ambulatory Visit: Payer: Self-pay | Admitting: Internal Medicine

## 2021-02-05 DIAGNOSIS — M659 Synovitis and tenosynovitis, unspecified: Secondary | ICD-10-CM | POA: Diagnosis not present

## 2021-02-05 DIAGNOSIS — M25561 Pain in right knee: Secondary | ICD-10-CM | POA: Diagnosis not present

## 2021-02-08 ENCOUNTER — Other Ambulatory Visit (INDEPENDENT_AMBULATORY_CARE_PROVIDER_SITE_OTHER): Payer: Medicare Other

## 2021-02-08 DIAGNOSIS — D509 Iron deficiency anemia, unspecified: Secondary | ICD-10-CM

## 2021-02-08 DIAGNOSIS — R790 Abnormal level of blood mineral: Secondary | ICD-10-CM | POA: Diagnosis not present

## 2021-02-08 LAB — CBC WITH DIFFERENTIAL/PLATELET
Basophils Absolute: 0 10*3/uL (ref 0.0–0.1)
Basophils Relative: 0.6 % (ref 0.0–3.0)
Eosinophils Absolute: 0.1 10*3/uL (ref 0.0–0.7)
Eosinophils Relative: 1.7 % (ref 0.0–5.0)
HCT: 42.3 % (ref 39.0–52.0)
Hemoglobin: 14.1 g/dL (ref 13.0–17.0)
Lymphocytes Relative: 17.8 % (ref 12.0–46.0)
Lymphs Abs: 1.3 10*3/uL (ref 0.7–4.0)
MCHC: 33.4 g/dL (ref 30.0–36.0)
MCV: 91.4 fl (ref 78.0–100.0)
Monocytes Absolute: 0.5 10*3/uL (ref 0.1–1.0)
Monocytes Relative: 6.8 % (ref 3.0–12.0)
Neutro Abs: 5.3 10*3/uL (ref 1.4–7.7)
Neutrophils Relative %: 73.1 % (ref 43.0–77.0)
Platelets: 307 10*3/uL (ref 150.0–400.0)
RBC: 4.62 Mil/uL (ref 4.22–5.81)
RDW: 12.8 % (ref 11.5–15.5)
WBC: 7.2 10*3/uL (ref 4.0–10.5)

## 2021-02-08 LAB — IBC + FERRITIN
Ferritin: 39.6 ng/mL (ref 22.0–322.0)
Iron: 71 ug/dL (ref 42–165)
Saturation Ratios: 19.1 % — ABNORMAL LOW (ref 20.0–50.0)
TIBC: 372.4 ug/dL (ref 250.0–450.0)
Transferrin: 266 mg/dL (ref 212.0–360.0)

## 2021-02-11 DIAGNOSIS — M659 Synovitis and tenosynovitis, unspecified: Secondary | ICD-10-CM | POA: Diagnosis not present

## 2021-04-23 ENCOUNTER — Encounter: Payer: BC Managed Care – PPO | Admitting: Internal Medicine
